# Patient Record
Sex: Female | Born: 1975
Health system: Southern US, Community
[De-identification: ages and names within clinical notes are randomized; demographics above are authoritative.]

## PROBLEM LIST (undated history)

## (undated) DIAGNOSIS — N852 Hypertrophy of uterus: Secondary | ICD-10-CM

## (undated) DIAGNOSIS — R12 Heartburn: Secondary | ICD-10-CM

## (undated) DIAGNOSIS — M722 Plantar fascial fibromatosis: Secondary | ICD-10-CM

## (undated) DIAGNOSIS — J309 Allergic rhinitis, unspecified: Secondary | ICD-10-CM

## (undated) DIAGNOSIS — I10 Essential (primary) hypertension: Secondary | ICD-10-CM

## (undated) DIAGNOSIS — E119 Type 2 diabetes mellitus without complications: Secondary | ICD-10-CM

## (undated) DIAGNOSIS — D219 Benign neoplasm of connective and other soft tissue, unspecified: Secondary | ICD-10-CM

## (undated) DIAGNOSIS — J302 Other seasonal allergic rhinitis: Secondary | ICD-10-CM

## (undated) DIAGNOSIS — G43909 Migraine, unspecified, not intractable, without status migrainosus: Secondary | ICD-10-CM

## (undated) DIAGNOSIS — E0590 Thyrotoxicosis, unspecified without thyrotoxic crisis or storm: Secondary | ICD-10-CM

## (undated) DIAGNOSIS — R7303 Prediabetes: Secondary | ICD-10-CM

## (undated) HISTORY — DX: Hypertrophy of uterus: N85.2

## (undated) HISTORY — DX: Type 2 diabetes mellitus without complications: E11.9

## (undated) HISTORY — DX: Essential (primary) hypertension: I10

## (undated) HISTORY — DX: Prediabetes: R73.03

## (undated) HISTORY — DX: Migraine, unspecified, not intractable, without status migrainosus: G43.909

## (undated) HISTORY — DX: Allergic rhinitis, unspecified: J30.9

## (undated) HISTORY — DX: Plantar fascial fibromatosis: M72.2

## (undated) HISTORY — DX: Benign neoplasm of connective and other soft tissue, unspecified: D21.9

## (undated) HISTORY — DX: Thyrotoxicosis, unspecified without thyrotoxic crisis or storm: E05.90

## (undated) HISTORY — DX: Heartburn: R12

## (undated) HISTORY — DX: Other seasonal allergic rhinitis: J30.2

---

## 2005-12-09 DIAGNOSIS — I517 Cardiomegaly: Secondary | ICD-10-CM

## 2005-12-09 HISTORY — DX: Cardiomegaly: I51.7

## 2009-05-17 ENCOUNTER — Emergency Department (HOSPITAL_COMMUNITY): Admission: EM | Admit: 2009-05-17 | Discharge: 2009-05-17 | Payer: Self-pay | Admitting: Family Medicine

## 2011-02-27 ENCOUNTER — Inpatient Hospital Stay (INDEPENDENT_AMBULATORY_CARE_PROVIDER_SITE_OTHER)
Admission: RE | Admit: 2011-02-27 | Discharge: 2011-02-27 | Disposition: A | Discharge status: Home / Self Care | Payer: Self-pay | Source: Ambulatory Visit | Attending: Emergency Medicine | Admitting: Emergency Medicine

## 2011-02-27 DIAGNOSIS — J309 Allergic rhinitis, unspecified: Secondary | ICD-10-CM

## 2012-05-12 ENCOUNTER — Other Ambulatory Visit: Payer: Self-pay | Admitting: Gastroenterology

## 2012-05-12 DIAGNOSIS — R1011 Right upper quadrant pain: Secondary | ICD-10-CM

## 2012-05-12 DIAGNOSIS — R110 Nausea: Secondary | ICD-10-CM

## 2012-05-25 ENCOUNTER — Ambulatory Visit (HOSPITAL_COMMUNITY)
Admission: RE | Admit: 2012-05-25 | Discharge: 2012-05-25 | Disposition: A | Discharge status: Home / Self Care | Payer: 59 | Source: Ambulatory Visit | Attending: Gastroenterology | Admitting: Gastroenterology

## 2012-05-25 ENCOUNTER — Encounter (HOSPITAL_COMMUNITY)
Admission: RE | Admit: 2012-05-25 | Discharge: 2012-05-25 | Disposition: A | Discharge status: Home / Self Care | Payer: 59 | Source: Ambulatory Visit | Attending: Gastroenterology | Admitting: Gastroenterology

## 2012-05-25 DIAGNOSIS — R1011 Right upper quadrant pain: Secondary | ICD-10-CM | POA: Insufficient documentation

## 2012-05-25 DIAGNOSIS — R110 Nausea: Secondary | ICD-10-CM | POA: Insufficient documentation

## 2012-05-25 MED ORDER — SINCALIDE 5 MCG IJ SOLR
0.0200 ug/kg | Freq: Once | INTRAMUSCULAR | Status: AC
  Administered 2012-05-25: 1.6 ug via INTRAVENOUS

## 2012-05-25 MED ORDER — SINCALIDE 5 MCG IJ SOLR
INTRAMUSCULAR | Status: AC
  Filled 2012-05-25: qty 5

## 2012-05-25 MED ORDER — TECHNETIUM TC 99M MEBROFENIN IV KIT
5.0000 | PACK | Freq: Once | INTRAVENOUS | Status: AC | PRN
  Administered 2012-05-25: 5 via INTRAVENOUS

## 2013-03-11 ENCOUNTER — Emergency Department (HOSPITAL_COMMUNITY): Payer: 59

## 2013-03-11 ENCOUNTER — Emergency Department (HOSPITAL_COMMUNITY)
Admission: EM | Admit: 2013-03-11 | Discharge: 2013-03-11 | Disposition: A | Discharge status: Home / Self Care | Payer: 59 | Attending: Emergency Medicine | Admitting: Emergency Medicine

## 2013-03-11 ENCOUNTER — Encounter (HOSPITAL_COMMUNITY): Payer: Self-pay | Admitting: Cardiology

## 2013-03-11 DIAGNOSIS — M542 Cervicalgia: Secondary | ICD-10-CM | POA: Insufficient documentation

## 2013-03-11 DIAGNOSIS — R0789 Other chest pain: Secondary | ICD-10-CM | POA: Insufficient documentation

## 2013-03-11 DIAGNOSIS — M25519 Pain in unspecified shoulder: Secondary | ICD-10-CM | POA: Insufficient documentation

## 2013-03-11 DIAGNOSIS — R51 Headache: Secondary | ICD-10-CM | POA: Insufficient documentation

## 2013-03-11 DIAGNOSIS — R079 Chest pain, unspecified: Secondary | ICD-10-CM

## 2013-03-11 LAB — BASIC METABOLIC PANEL
BUN: 11 mg/dL (ref 6–23)
CO2: 22 mEq/L (ref 19–32)
Calcium: 9.3 mg/dL (ref 8.4–10.5)
Chloride: 104 mEq/L (ref 96–112)
Creatinine, Ser: 0.71 mg/dL (ref 0.50–1.10)
GFR calc Af Amer: >90 mL/min (ref >90)
GFR calc non Af Amer: >90 mL/min (ref >90)
Glucose, Bld: 94 mg/dL (ref 70–99)
Potassium: 4.5 mEq/L (ref 3.5–5.1)
Sodium: 136 mEq/L (ref 135–145)

## 2013-03-11 LAB — POCT I-STAT TROPONIN I: Troponin i, poc: 0.01 ng/mL (ref 0.00–0.08)

## 2013-03-11 LAB — CBC
HCT: 39.2 % (ref 36.0–46.0)
Hemoglobin: 13.8 g/dL (ref 12.0–15.0)
MCH: 30.2 pg (ref 26.0–34.0)
MCHC: 35.2 g/dL (ref 30.0–36.0)
MCV: 85.8 fL (ref 78.0–100.0)
Platelets: 273 10*3/uL (ref 150–400)
RBC: 4.57 MIL/uL (ref 3.87–5.11)
RDW: 13.4 % (ref 11.5–15.5)
WBC: 4.8 10*3/uL (ref 4.0–10.5)

## 2013-03-11 MED ORDER — SODIUM CHLORIDE 0.9 % IV BOLUS (SEPSIS)
1000.0000 mL | Freq: Once | INTRAVENOUS | Status: AC
  Administered 2013-03-11: 1000 mL via INTRAVENOUS

## 2013-03-11 MED ORDER — DIPHENHYDRAMINE HCL 50 MG/ML IJ SOLN
25.0000 mg | Freq: Once | INTRAMUSCULAR | Status: AC
  Administered 2013-03-11: 25 mg via INTRAVENOUS
  Filled 2013-03-11: qty 1

## 2013-03-11 MED ORDER — LORAZEPAM 2 MG/ML IJ SOLN
1.0000 mg | Freq: Once | INTRAMUSCULAR | Status: AC
  Administered 2013-03-11: 1 mg via INTRAVENOUS
  Filled 2013-03-11: qty 1

## 2013-03-11 MED ORDER — DEXAMETHASONE SODIUM PHOSPHATE 10 MG/ML IJ SOLN
10.0000 mg | Freq: Once | INTRAMUSCULAR | Status: AC
  Administered 2013-03-11: 10 mg via INTRAVENOUS
  Filled 2013-03-11: qty 1

## 2013-03-11 NOTE — ED Notes (Signed)
Pt reports she has been having headaches for the past couple of days. States she also has been having intermittent episodes of chest pain. Denies any n/v or SOB with the pain.

## 2013-03-11 NOTE — ED Provider Notes (Signed)
History     CSN: [id number]  Arrival date & time 03/11/13  0945   First MD Initiated Contact with Patient 03/11/13 1018      Chief Complaint  Patient presents with  . Headache  . Chest Pain    (Consider location/radiation/quality/duration/timing/severity/associated sxs/prior treatment) HPI  37 year old female presents to the emergency department with chief complaint of headache and chest pain.  Patient states that her brother-in-law died suddenly 2 weeks ago.  Since that time she has had a headache.  She's also had significant stress and anxiety during her grieving process.  He does states that she has been taking Tylenol and Advil.  She states that it helped the pain but then her headache returns.  The patient denies a history of migraine headaches.  Denies photophobia, phonophobia, UL throbbing, N/V, visual changes, stiff neck, rash, or "thunderclap" onset.  She does have some neck and shoulder pain.  The patient also complains of intermittent chest pain.  She denies any nausea, vomiting, shortness of breath or diaphoresis.  She has no family history of MI, no hypertension, no diabetes, nonsmoker nondrinker.  She denies any illicit drug use. She denies history of DVT or PE.  The patient uses the melena IUD for birth control. Denies unilateral weakness, facial asymmetry, difficulty with speech, change in gait, or vertigo.    History reviewed. No pertinent past medical history.  History reviewed. No pertinent past surgical history.  History reviewed. No pertinent family history.  History  Substance Use Topics  . Smoking status: Never Smoker   . Smokeless tobacco: Not on file  . Alcohol Use: Yes    OB History   Grav Para Term Preterm Abortions TAB SAB Ect Mult Living                  Review of Systems Ten systems reviewed and are negative for acute change, except as noted in the HPI.   Allergies  Review of patient's allergies indicates no known allergies.  Home  Medications   Current Outpatient Rx  Name  Route  Sig  Dispense  Refill  . Multiple Vitamin (MULTIVITAMIN WITH MINERALS) TABS   Oral   Take 1 tablet by mouth daily.           BP 121/84  Pulse 68  Temp(Src) 97.7 F (36.5 C) (Oral)  Resp 24  SpO2 100%  LMP 03/07/2013  Physical Exam  Constitutional: She is oriented to person, place, and time. She appears well-developed and well-nourished. No distress.  HENT:  Head: Normocephalic and atraumatic.  Eyes: Conjunctivae are normal. No scleral icterus.  Neck: Normal range of motion.  Tender to palpation in the para cervical spinal muscles and trapezius.  Patient also has tenderness in the suboccipital and occipital region.  This feels similar to her headache.   Cardiovascular: Normal rate, regular rhythm and normal heart sounds.  Exam reveals no gallop and no friction rub.   No murmur heard. Pulmonary/Chest: Effort normal and breath sounds normal. No respiratory distress.  Tender to palpation of the chest wall worse on the left side.  This also feels the same as her chest pain complaint.  Abdominal: Soft. Bowel sounds are normal. She exhibits no distension and no mass. There is no tenderness. There is no guarding.  Neurological: She is alert and oriented to person, place, and time.  Skin: Skin is warm and dry. She is not diaphoretic.    ED Course  Procedures (including critical care time)   Results  for orders placed during the hospital encounter of 03/11/13  CBC      Result Value Range   WBC 4.8  4.0 - 10.5 K/uL   RBC 4.57  3.87 - 5.11 MIL/uL   Hemoglobin 13.8  12.0 - 15.0 g/dL   HCT 03.4  74.2 - 59.5 %   MCV 85.8  78.0 - 100.0 fL   MCH 30.2  26.0 - 34.0 pg   MCHC 35.2  30.0 - 36.0 g/dL   RDW 63.8  75.6 - 43.3 %   Platelets 273  150 - 400 K/uL  BASIC METABOLIC PANEL      Result Value Range   Sodium 136  135 - 145 mEq/L   Potassium 4.5  3.5 - 5.1 mEq/L   Chloride 104  96 - 112 mEq/L   CO2 22  19 - 32 mEq/L   Glucose,  Bld 94  70 - 99 mg/dL   BUN 11  6 - 23 mg/dL   Creatinine, Ser 2.95  0.50 - 1.10 mg/dL   Calcium 9.3  8.4 - 18.8 mg/dL   GFR calc non Af Amer >90  >90 mL/min   GFR calc Af Amer >90  >90 mL/min  POCT I-STAT TROPONIN I      Result Value Range   Troponin i, poc 0.01  0.00 - 0.08 ng/mL   Comment 3              Dg Chest 2 View  03/11/2013  *RADIOLOGY REPORT*  Clinical Data: Headache, chest pain  CHEST - 2 VIEW  Comparison: None.  Findings: Lungs are clear. No pleural effusion or pneumothorax.  Cardiomediastinal silhouette is within normal limits.  Visualized osseous structures are within normal limits.  IMPRESSION: No evidence of acute cardiopulmonary disease.   Original Report Authenticated By: Charline Bills, M.D.     Date: 03/11/2013  Rate: 79  Rhythm: normal sinus rhythm  QRS Axis: normal  Intervals: normal  ST/T Wave abnormalities: normal  Conduction Disutrbances: none  Narrative Interpretation:   Old EKG Reviewed: none available      1. Headache   2. Chest pain       MDM  11:45 AM Patient without lab abnormality.  She is PERC and wells negative. I do not suspect ACS.  Overall ECG and negative troponin.  Her pain is reproducible with palpation.  Symptoms non concerning for [facility], ICH, Meningitis, or temporal arteritis. Pt is afebrile with no focal neuro deficits, nuchal rigidity, or change in vision. Treating the patient headache with migraine cocktail and fluids.  We'll reevaluate shortly.  11:49 AM Filed Vitals:   03/11/13 0949 03/11/13 1118 03/11/13 1200 03/11/13 1303  BP: 133/86 121/84 115/86 121/86  Pulse: 78 68 68 71  Temp: 97.7 F (36.5 C)     TempSrc: Oral     Resp: 16 24  16   SpO2: 99% 100% 100% 100%  Patient states that her headache is fully resolved. Presentation is non concerning for [facility], ICH, Meningitis, or temporal arteritis. Pt is afebrile with no focal neuro deficits, nuchal rigidity, or change in vision. Pt is to follow up with PCP to discuss  prophylactic medication. Pt verbalizes understanding and is agreeable with plan to dc.       Arthor Captain, PA-C 03/14/13 1154

## 2013-03-11 NOTE — ED Notes (Signed)
PA at bedside.

## 2013-03-15 NOTE — ED Provider Notes (Signed)
Medical screening examination/treatment/procedure(s) were performed by non-physician practitioner and as supervising physician I was immediately available for consultation/collaboration.  Hawraa Stambaugh R. Daveigh Batty, MD 03/15/13 0818 

## 2013-11-29 ENCOUNTER — Other Ambulatory Visit: Payer: Self-pay | Admitting: Family Medicine

## 2013-11-29 ENCOUNTER — Other Ambulatory Visit (HOSPITAL_COMMUNITY)
Admission: RE | Admit: 2013-11-29 | Discharge: 2013-11-29 | Disposition: A | Discharge status: Home / Self Care | Payer: 59 | Source: Ambulatory Visit | Attending: Family Medicine | Admitting: Family Medicine

## 2013-11-29 DIAGNOSIS — Z01419 Encounter for gynecological examination (general) (routine) without abnormal findings: Secondary | ICD-10-CM | POA: Insufficient documentation

## 2015-04-13 ENCOUNTER — Ambulatory Visit (INDEPENDENT_AMBULATORY_CARE_PROVIDER_SITE_OTHER): Payer: 59

## 2015-04-13 ENCOUNTER — Ambulatory Visit (INDEPENDENT_AMBULATORY_CARE_PROVIDER_SITE_OTHER): Payer: 59 | Admitting: Podiatry

## 2015-04-13 DIAGNOSIS — M722 Plantar fascial fibromatosis: Secondary | ICD-10-CM

## 2015-04-13 MED ORDER — TRIAMCINOLONE ACETONIDE 10 MG/ML IJ SUSP
10.0000 mg | Freq: Once | INTRAMUSCULAR | Status: AC
  Administered 2015-04-13: 10 mg

## 2015-04-13 NOTE — Progress Notes (Signed)
Subjective:     Patient ID: Beth King, female   DOB: [birth year], 39 y.o.   MRN: [id number]  HPI patient states my heel has been awful on my left foot for about 8 months and my right one bothers me at times but not to the same degree   Review of Systems  All other systems reviewed and are negative.      Objective:   Physical Exam  Constitutional: She is oriented to person, place, and time.  Cardiovascular: Intact distal pulses.   Musculoskeletal: Normal range of motion.  Neurological: She is oriented to person, place, and time.  Skin: Skin is warm.  Nursing note and vitals reviewed.  neurovascular status intact with muscle strength adequate range of motion within normal limits. Patient has significant flatfoot deformity bilateral with significant pain upon palpation plantar fascial left over right at the insertion of the tendon into the calcaneus. Patient is well oriented 3 and has good digital perfusion     Assessment:     Severe plantar fasciitis left over right heel with flatfoot deformity as complicating and contribute getting factor    Plan:     H&P and x-rays reviewed with patient. Injected the left plantar fashion 3 mg Kenalog 5 mill grams Xylocaine and applied fascial brace bilateral and order to reduce the stress against the arch is. Discussed long-term orthotics and reappoint one week to reevaluate

## 2015-04-13 NOTE — Patient Instructions (Signed)

## 2015-04-13 NOTE — Progress Notes (Signed)
   Subjective:    Patient ID: Beth King, female    DOB: Jun 29, 1976, 38 y.o.   MRN: [id number]  HPI Pt presents with bilateral foot pain worse in left foot and in the morning. She has tried otc inserts nsaids and ice therapy with no success   Review of Systems  All other systems reviewed and are negative.      Objective:   Physical Exam        Assessment & Plan:

## 2015-04-20 ENCOUNTER — Ambulatory Visit: Payer: 59 | Admitting: Podiatry

## 2015-04-26 ENCOUNTER — Ambulatory Visit (INDEPENDENT_AMBULATORY_CARE_PROVIDER_SITE_OTHER): Payer: 59 | Admitting: Podiatry

## 2015-04-26 ENCOUNTER — Encounter: Payer: Self-pay | Admitting: Podiatry

## 2015-04-26 VITALS — BP 120/72 | HR 97 | Resp 12

## 2015-04-26 DIAGNOSIS — M722 Plantar fascial fibromatosis: Secondary | ICD-10-CM

## 2015-04-27 NOTE — Progress Notes (Signed)
Subjective:     Patient ID: Beth King, female   DOB: 07-19-1976, 39 y.o.   MRN: [id number]  HPI patient presents stating my heels feel better but I've had heel pain for so long and I still have flatfeet deformity   Review of Systems     Objective:   Physical Exam Neurovascular status muscle strength was adequate range of motion within normal limits and noted to have moderate depression of the arch left and right with inflammation still plantar heel upon deep palpation    Assessment:     Patient has moderate collapse of the medial longitudinal arch bilateral with plantar fasciitis bilateral to moderate nature    Plan:     Reviewed condition and discussed long-term anti-inflammatory physical therapy and supportive shoe gear usage. At this time went ahead and scanned for custom orthotics to reduce the stress against the plantar arch and heels

## 2015-07-04 ENCOUNTER — Ambulatory Visit: Payer: 59 | Admitting: *Deleted

## 2015-07-04 DIAGNOSIS — M722 Plantar fascial fibromatosis: Secondary | ICD-10-CM

## 2015-07-04 NOTE — Patient Instructions (Signed)

## 2015-07-05 NOTE — Progress Notes (Signed)
Patient ID: Beth King, female   DOB: 05-Dec-1976, 39 y.o.   MRN: [id number] Patient presents for orthotic pick up.  Verbal and written break in and wear instructions given.  Patient will follow up in 4 weeks if symptoms worsen or fail to improve.

## 2015-07-12 ENCOUNTER — Encounter: Payer: Self-pay | Admitting: Podiatry

## 2015-07-12 ENCOUNTER — Ambulatory Visit (INDEPENDENT_AMBULATORY_CARE_PROVIDER_SITE_OTHER): Payer: 59 | Admitting: Podiatry

## 2015-07-12 VITALS — BP 118/77 | HR 99 | Resp 15

## 2015-07-12 DIAGNOSIS — M722 Plantar fascial fibromatosis: Secondary | ICD-10-CM | POA: Diagnosis not present

## 2015-07-12 MED ORDER — TRIAMCINOLONE ACETONIDE 10 MG/ML IJ SUSP
10.0000 mg | Freq: Once | INTRAMUSCULAR | Status: AC
  Administered 2015-07-12: 10 mg

## 2015-07-12 MED ORDER — DICLOFENAC SODIUM 75 MG PO TBEC: 75.0000 mg | DELAYED_RELEASE_TABLET | Freq: Two times a day (BID) | ORAL | Status: DC

## 2015-07-12 NOTE — Progress Notes (Signed)
Subjective:     Patient ID: Beth King, female   DOB: Sep 26, 1976, 39 y.o.   MRN: [id number]  HPI patient presents stating I have a lot of pain still in my left heel and so far the orthotics or not helping and it's bad during the day when I'm ambulatory   Review of Systems     Objective:   Physical Exam Neurovascular status intact muscle strength adequate with significant discomfort in the plantar aspect of the left heel at the insertion of the tendon into the calcaneus with inflammation and fluid buildup noted. Depressed arch also noted    Assessment:     Plantar fasciitis left with inflammation and fluid at the insertion of the tendon into the calcaneus    Plan:     H&P and condition reviewed with patient. Today I went ahead and I injected the plantar fascia 3 [place] Kenalog 5 mill grams Xylocaine and dispensed night splint and discussed the possibility if this does not get better due to long-term nature that it may require surgical intervention. Patient is scheduled for reappoint in 4 weeks. Also placed on diclofenac 75 mg twice a day

## 2015-07-12 NOTE — Patient Instructions (Signed)

## 2015-08-09 ENCOUNTER — Ambulatory Visit: Payer: 59 | Admitting: Podiatry

## 2015-10-19 ENCOUNTER — Other Ambulatory Visit: Payer: Self-pay | Admitting: Obstetrics and Gynecology

## 2015-10-19 DIAGNOSIS — N6001 Solitary cyst of right breast: Secondary | ICD-10-CM

## 2015-10-25 ENCOUNTER — Other Ambulatory Visit: Payer: 59

## 2015-11-01 ENCOUNTER — Ambulatory Visit
Admission: RE | Admit: 2015-11-01 | Discharge: 2015-11-01 | Disposition: A | Discharge status: Home / Self Care | Payer: 59 | Source: Ambulatory Visit | Attending: Obstetrics and Gynecology | Admitting: Obstetrics and Gynecology

## 2015-11-01 DIAGNOSIS — N6001 Solitary cyst of right breast: Secondary | ICD-10-CM

## 2016-01-08 DIAGNOSIS — M722 Plantar fascial fibromatosis: Secondary | ICD-10-CM | POA: Diagnosis not present

## 2016-01-11 ENCOUNTER — Ambulatory Visit: Payer: 59 | Attending: Orthopaedic Surgery | Admitting: Physical Therapy

## 2016-01-11 DIAGNOSIS — M259 Joint disorder, unspecified: Secondary | ICD-10-CM | POA: Insufficient documentation

## 2016-01-11 DIAGNOSIS — M79672 Pain in left foot: Secondary | ICD-10-CM | POA: Insufficient documentation

## 2016-01-11 DIAGNOSIS — R29898 Other symptoms and signs involving the musculoskeletal system: Secondary | ICD-10-CM

## 2016-01-11 DIAGNOSIS — R6889 Other general symptoms and signs: Secondary | ICD-10-CM | POA: Diagnosis not present

## 2016-01-11 DIAGNOSIS — M79671 Pain in right foot: Secondary | ICD-10-CM | POA: Insufficient documentation

## 2016-01-11 DIAGNOSIS — R269 Unspecified abnormalities of gait and mobility: Secondary | ICD-10-CM | POA: Insufficient documentation

## 2016-01-11 NOTE — Patient Instructions (Signed)
   Deloma Spindle PT, DPT, LAT, ATC  [facility] Phone: [phone]     

## 2016-01-11 NOTE — Therapy (Signed)
[place], [place], [address] Phone: [phone]   Fax:  [phone]  Physical Therapy Evaluation  Patient Details  Name: Beth King MRN: [id number] Date of Birth: [birth year] Referring Provider: Christena Flake Md  Encounter Date: 01/11/2016      PT End of Session - 01/11/16 1251    Visit Number 1   Number of Visits 12   Date for PT Re-Evaluation 02/22/16   PT Start Time 0845   PT Stop Time 0930   PT Time Calculation (min) 45 min   Activity Tolerance Patient tolerated treatment well   Behavior During Therapy [place] for tasks assessed/performed      No past medical history on file.  No past surgical history on file.  There were no vitals filed for this visit.  Visit Diagnosis:  Heel pain, bilateral - Plan: PT plan of care cert/re-cert  Abnormality of gait - Plan: PT plan of care cert/re-cert  Ankle weakness - Plan: PT plan of care cert/re-cert  Activity intolerance - Plan: PT plan of care cert/re-cert      Subjective Assessment - 01/11/16 0857    Subjective pt is a 40 y.o F with CC of bil plantar Fasciitis with L>R that has been going on for 2 years. she reports having 2 shots in the foot in her left foot and was given some stretches. she reports most pain when waking up in the morning a t a 9/10 in L and 5/10 in L.  in the last couple of months she reports the pain has gotten worse with pain mostly in the heel but with radiating pain to the calf.    Limitations Standing;Walking;House hold activities   How long can you sit comfortably? 1 hour   How long can you stand comfortably? 10 -15 min   How long can you walk comfortably? 30 min   Diagnostic tests 04/13/2015 x-ray plantar fasciits bil   Patient Stated Goals to be normal and walk without pain, and return to the gym.    Currently in Pain? Yes   Pain Score 8   5/10 on R, 8/10   Pain Location Foot   Pain Orientation Right;Left  L>R   Pain  Descriptors / Indicators Sharp   Pain Type Chronic pain   Pain Radiating Towards bil calves   Pain Onset More than a month ago   Aggravating Factors  first getting up in the morning, Hot water, wearing heels, prolonged standing,    Pain Relieving Factors ice            [facility] PT Assessment - 01/11/16 0902    Assessment   Medical Diagnosis bil plantrar fasciitis L>R   Referring Provider Christena Flake Md   Onset Date/Surgical Date --  2 years   Hand Dominance Right   Next MD Visit --  6 weeks   Prior Therapy No    Precautions   Precautions None   Restrictions   Weight Bearing Restrictions No   Balance Screen   Has the patient fallen in the past 6 months No   Has the patient had a decrease in activity level because of a fear of falling?  No   Is the patient reluctant to leave their home because of a fear of falling?  No   Home Social worker Private residence   Living Arrangements Spouse/significant other   Available Help at Discharge Available PRN/intermittently   Type of [place]  Home Access Stairs to enter   Entrance Stairs-Number of Steps 3   Entrance Stairs-Rails None   Home Layout One level   Home Equipment Other (comment)  night splint   Prior Function   Level of Independence Independent;Independent with basic ADLs   Vocation Part time employment  Nurse tech   Vocation Requirements prolonged walking, standing, lifting, moving, pushing, pulling   Leisure eating, going gym,    Cognition   Overall Cognitive Status Within Functional Limits for tasks assessed   Observation/Other Assessments   Lower Extremity Functional Scale  40/80   Posture/Postural Control   Posture/Postural Control Postural limitations   Postural Limitations Rounded Shoulders;Forward head   ROM / Strength   AROM / PROM / Strength AROM;PROM;Strength   AROM   AROM Assessment Site Ankle   Right/Left Ankle Right;Left   Right Ankle Dorsiflexion 6  from neutral   Right  Ankle Plantar Flexion 30  from nuetral   Right Ankle Inversion 22   Right Ankle Eversion 16   Left Ankle Dorsiflexion 8  past neutral   Left Ankle Plantar Flexion 30  past nuetral   Left Ankle Inversion 20   Left Ankle Eversion 18   PROM   PROM Assessment Site Ankle   Right/Left Ankle Right;Left   Right Ankle Dorsiflexion 22  from neutral   Right Ankle Plantar Flexion 40   Right Ankle Inversion 30   Right Ankle Eversion 24   Left Ankle Dorsiflexion 20  past neutral   Left Ankle Plantar Flexion 32  past neutral   Left Ankle Inversion 30   Left Ankle Eversion 24   Strength   Strength Assessment Site Ankle   Right/Left Ankle Right;Left   Right Ankle Dorsiflexion 4/5   Right Ankle Plantar Flexion 4-/5   Right Ankle Inversion 3+/5   Right Ankle Eversion 3+/5   Left Ankle Dorsiflexion 4-/5   Left Ankle Plantar Flexion 4/5   Left Ankle Inversion 4-/5   Left Ankle Eversion 3+/5   Palpation   Palpation comment L Medial Calcaneal tubercle tenderness, along plantar fascia with great toe extension, tightness/tenderness along gastroc and soleus with multiple trigger points noted    Ambulation/Gait   Gait Pattern Step-through pattern;Decreased stride length;Antalgic;Right steppage;Left steppage                           PT Education - 01/11/16 1251    Education provided Yes   Education Details evaluation findings, POC, Goals, HEP   Person(s) Educated Patient   Methods Explanation   Comprehension Verbalized understanding          PT Short Term Goals - 01/11/16 1300    PT SHORT TERM GOAL #1   Title pt will be I with inital HEP (02/01/2016)   Time 3   Period Weeks   Status New   PT SHORT TERM GOAL #2   Title pt will be able to verbalize and demonstrate techniques to reduce bil heel pain and inflammation via RICE(02/01/2016)   Time 3   Period Weeks   Status New           PT Long Term Goals - 01/11/16 1303    PT LONG TERM GOAL #1   Title pt will  be I with all HEP as of last visit ( 02/22/2016)   Time 6   Period Weeks   Status New   PT LONG TERM GOAL #2   Title pt will improve bil  ankle strength to >/= 4+/5 in all planes to assist with prlonged standing with </=3/10 pain (02/22/2016)   Time 6   Period Weeks   Status New   PT LONG TERM GOAL #3   Title pt will be able to stand for >/= 45 minutes with </=3/10 pain to assist with job related activities (02/22/2016)   Time 6   Period Weeks   Status New   PT LONG TERM GOAL #4   Title pt will improve her LEFS score by >/=10 points to demonstrate improvement in function (02/22/2016)   Time 6   Period Weeks   Status New               Plan - 01/11/16 1252    Clinical Impression Statement Fumiko presents a low complexity evluation with bil plantat fasciitis with L>R. She dmeonstrate functional Ankle mobility bil with limited AROM in plantarflexion. MMT presented with weakness of bil ankles with no pain during testing, in all planes. she demonstrated tendneress in the medial calcaneal tubercles bil with tightness and pain the gastroc/ soleus. She currently ambualted with an antlalgic gait pattern with limited stride and step length bil. She would benefit from physical therapy to decrease bil heel pain and return to PLOF by addressing the impairments listed.    Pt will benefit from skilled therapeutic intervention in order to improve on the following deficits Pain;Improper body mechanics;Difficulty walking;Decreased range of motion;Decreased endurance;Decreased activity tolerance;Decreased strength;Hypomobility;Decreased balance   Rehab Potential Good   PT Frequency 2x / week   PT Duration 6 weeks   PT Treatment/Interventions ADLs/Self Care Home Management;Cryotherapy;Electrical Stimulation;Iontophoresis 4mg /ml Dexamethasone;Moist Heat;Therapeutic exercise;Therapeutic activities;Manual techniques;Taping;Dry needling;Passive range of motion;Patient/family education;Ultrasound   PT Next  Visit Plan assess and review HEP, discuss dry needling, iontophoresis if cert is signed, calf stretching, ankle strengthening, Manual for PF   PT Home Exercise Plan calf stretchs, ankle strengthening, seated towel scrunches, rolling on frozen ice bottle    Consulted and Agree with Plan of Care Patient         Problem List There are no active problems to display for this patient.  Starr [place] PT, DPT, LAT, ATC  01/11/2016  1:12 PM     [facility] [address] [place], [place], [address] Phone: [phone]   Fax:  [phone]  Name: Beth King MRN: [id number] Date of Birth: Mar 26, 1976

## 2016-01-16 ENCOUNTER — Ambulatory Visit: Payer: 59 | Admitting: Physical Therapy

## 2016-01-16 DIAGNOSIS — M259 Joint disorder, unspecified: Secondary | ICD-10-CM | POA: Diagnosis not present

## 2016-01-16 DIAGNOSIS — M79672 Pain in left foot: Secondary | ICD-10-CM | POA: Diagnosis not present

## 2016-01-16 DIAGNOSIS — M79671 Pain in right foot: Secondary | ICD-10-CM | POA: Diagnosis not present

## 2016-01-16 DIAGNOSIS — R269 Unspecified abnormalities of gait and mobility: Secondary | ICD-10-CM

## 2016-01-16 DIAGNOSIS — R6889 Other general symptoms and signs: Secondary | ICD-10-CM | POA: Diagnosis not present

## 2016-01-16 NOTE — Therapy (Signed)
[place], [place], [address] Phone: [phone]   Fax:  [phone]  Physical Therapy Treatment  Patient Details  Name: Beth King MRN: [id number] Date of Birth: Dec 21, 1975 Referring Provider: Christena Flake Md  Encounter Date: 01/16/2016      PT End of Session - 01/16/16 1511    Visit Number 2   Number of Visits 12   Date for PT Re-Evaluation 02/22/16   PT Start Time 1034   PT Stop Time 1102   PT Time Calculation (min) 28 min   Activity Tolerance Patient tolerated treatment well   Behavior During Therapy [facility] for tasks assessed/performed      No past medical history on file.  No past surgical history on file.  There were no vitals filed for this visit.  Visit Diagnosis:  Heel pain, bilateral  Abnormality of gait      Subjective Assessment - 01/16/16 1501    Subjective Short session, patient late thinking her appointment was another time.   She has orthotics she had made 6 months ago and they do no help.  She wears shoes in the house avoiding barefeet.     Currently in Pain? Yes   Pain Score 8   LT 8/10 RT 5/10   Pain Location Foot   Pain Orientation Right;Left   Pain Descriptors / Indicators Aching;Sharp   Pain Radiating Towards both calves.    Aggravating Factors  first getting up, walking. standing   Pain Relieving Factors ice,  rolling on frozen bottle                         [facility] Adult PT Treatment/Exercise - 01/16/16 1045    Iontophoresis   Type of Iontophoresis Dexamethasone   Location heels , both medial   Dose 1cc of 4 mg/ml   Time 9   Manual Therapy   Manual therapy comments Manual soft tissue, instrument assist to fascia tissue, heel , calf distal.  tissue softened, and sensitive. LT great toe extension very stiff. ,  gentle toe stretches.                  PT Education - 01/16/16 1510    Education provided Yes   Education Details ionto  precautions   Person(s) Educated Patient   Methods Explanation;Handout   Comprehension Verbalized understanding          PT Short Term Goals - 01/16/16 1517    PT SHORT TERM GOAL #1   Title pt will be I with inital HEP (02/01/2016)   Time 3   Period Weeks   Status On-going   PT SHORT TERM GOAL #2   Title pt will be able to verbalize and demonstrate techniques to reduce bil heel pain and inflammation via RICE(02/01/2016)   Time 3   Period Weeks   Status On-going           PT Long Term Goals - 01/11/16 1303    PT LONG TERM GOAL #1   Title pt will be I with all HEP as of last visit ( 02/22/2016)   Time 6   Period Weeks   Status New   PT LONG TERM GOAL #2   Title pt will improve bil ankle strength to >/= 4+/5 in all planes to assist with prlonged standing with </=3/10 pain (02/22/2016)   Time 6   Period Weeks   Status New   PT LONG TERM  GOAL #3   Title pt will be able to stand for >/= 45 minutes with </=3/10 pain to assist with job related activities (02/22/2016)   Time 6   Period Weeks   Status New   PT LONG TERM GOAL #4   Title pt will improve her LEFS score by >/=10 points to demonstrate improvement in function (02/22/2016)   Time 6   Period Weeks   Status New               Plan - 01/16/16 1512    Clinical Impression Statement Short session.  Manual helpful but didn't really have to time to really work on all fo calf,  distal 1/3 of both mainly.  I suggested she return to clinic for orthotics modification since she has them and cannot use them. No new goals met.  Patient has thick skin on feet even tho she has not been walking barefeet for 2 years.     PT Next Visit Plan assess and review HEP, discuss dry needling, iontophoresis assess, calf stretching, ankle strengthening, Manual for PF   PT Home Exercise Plan calf stretchs, ankle strengthening, seated towel scrunches, rolling on frozen ice bottle    Consulted and Agree with Plan of Care Patient         Problem List There are no active problems to display for this patient.   [facility] 01/16/2016, 3:20 PM  [facility] [address] [place], [place], [address] Phone: [phone]   Fax:  [phone]  Name: Beth King MRN: [id number] Date of Birth: 06-28-1976    Melvenia Needles, PTA 01/16/2016 3:20 PM Phone: [phone] Fax: [phone]

## 2016-01-16 NOTE — Patient Instructions (Addendum)

## 2016-01-22 ENCOUNTER — Ambulatory Visit: Payer: 59 | Admitting: Physical Therapy

## 2016-01-22 DIAGNOSIS — M259 Joint disorder, unspecified: Secondary | ICD-10-CM | POA: Diagnosis not present

## 2016-01-22 DIAGNOSIS — M79672 Pain in left foot: Secondary | ICD-10-CM | POA: Diagnosis not present

## 2016-01-22 DIAGNOSIS — R269 Unspecified abnormalities of gait and mobility: Secondary | ICD-10-CM | POA: Diagnosis not present

## 2016-01-22 DIAGNOSIS — R6889 Other general symptoms and signs: Secondary | ICD-10-CM | POA: Diagnosis not present

## 2016-01-22 DIAGNOSIS — M79671 Pain in right foot: Secondary | ICD-10-CM

## 2016-01-22 DIAGNOSIS — R29898 Other symptoms and signs involving the musculoskeletal system: Secondary | ICD-10-CM

## 2016-01-22 NOTE — Therapy (Signed)
[place], [place], [address] Phone: [phone]   Fax:  [phone]  Physical Therapy Treatment  Patient Details  Name: Beth King MRN: [id number] Date of Birth: [birth year] Referring Provider: Christena Flake Md  Encounter Date: 01/22/2016      PT End of Session - 01/22/16 1302    Visit Number 3   Number of Visits 12   Date for PT Re-Evaluation 02/22/16   PT Start Time [id number]   PT Stop Time 1238   PT Time Calculation (min) 50 min   Activity Tolerance Patient tolerated treatment well   Behavior During Therapy [facility] for tasks assessed/performed      No past medical history on file.  No past surgical history on file.  There were no vitals filed for this visit.  Visit Diagnosis:  Heel pain, bilateral  Abnormality of gait  Ankle weakness  Activity intolerance      Subjective Assessment - 01/22/16 1156    Subjective "I haven't seen much change in the pain yet" she reports being consistent with her HEP has really helped is the water bottle.    Currently in Pain? Yes   Pain Score 8   L 8/10, R 3/10   Pain Orientation Left;Right   Pain Descriptors / Indicators Aching;Sharp   Pain Type Chronic pain   Pain Onset More than a month ago   Pain Frequency Constant   Aggravating Factors  first getting up in the morning, walking, standing,    Pain Relieving Factors ice, rolling on frozen bottle.                          [facility] Adult PT Treatment/Exercise - 01/22/16 1306    Iontophoresis   Type of Iontophoresis Dexamethasone   Location L heel   Dose 1CC    Time 6 hour stat patch   Manual Therapy   Manual Therapy Myofascial release;Soft tissue mobilization   Soft tissue mobilization instrument assisted STM over the medial calcaneal tubercle with twisting technique, along PF with Great toe extension,  along gastroc/soleus   Myofascial Release manual triggerpoint release x 3 in  gastroc/soleus, and in medial arch x 1    Ankle Exercises: Stretches   Soleus Stretch 2 reps;30 seconds   Gastroc Stretch 2 reps;30 seconds          Trigger Point Dry Needling - 01/22/16 1306    Consent Given? Yes   Education Handout Provided Yes   Muscles Treated Lower Body Quadratus plantae   Quadatus Plantae Response Twitch response elicited;Palpable increased muscle length  L only              PT Education - 01/22/16 1302    Education provided Yes   Education Details dry needling education   Person(s) Educated Patient   Methods Explanation   Comprehension Verbalized understanding          PT Short Term Goals - 01/16/16 1517    PT SHORT TERM GOAL #1   Title pt will be I with inital HEP (02/01/2016)   Time 3   Period Weeks   Status On-going   PT SHORT TERM GOAL #2   Title pt will be able to verbalize and demonstrate techniques to reduce bil heel pain and inflammation via RICE(02/01/2016)   Time 3   Period Weeks   Status On-going           PT Long Term Goals -  01/11/16 1303    PT LONG TERM GOAL #1   Title pt will be I with all HEP as of last visit ( 02/22/2016)   Time 6   Period Weeks   Status New   PT LONG TERM GOAL #2   Title pt will improve bil ankle strength to >/= 4+/5 in all planes to assist with prlonged standing with </=3/10 pain (02/22/2016)   Time 6   Period Weeks   Status New   PT LONG TERM GOAL #3   Title pt will be able to stand for >/= 45 minutes with </=3/10 pain to assist with job related activities (02/22/2016)   Time 6   Period Weeks   Status New   PT LONG TERM GOAL #4   Title pt will improve her LEFS score by >/=10 points to demonstrate improvement in function (02/22/2016)   Time 6   Period Weeks   Status New               Plan - 01/22/16 1302    Clinical Impression Statement Lacoria reports that she previously hasn't noticed much benefit from physical therapy for her feet. she provided consent for dry needling on the L  quadratus plantae and reported relief of pain following DN and instrument assisted STM ove rthe arch and medial calcaneal tubercle. with manual triggerpoint release of the L calf and stretching she reported 0/10 pain with standing and walking. ulitized another round of inontophoresis to prrevent further inflammation.    PT Next Visit Plan  assesss response to dry needling, iontophoresis assess, calf stretching, ankle strengthening, Manual for PF   PT Home Exercise Plan no new HEP   Consulted and Agree with Plan of Care Patient        Problem List There are no active problems to display for this patient.  Starr Lake PT, DPT, LAT, ATC  01/22/2016  1:09 PM      [facility] [address] [place], [place], [address] Phone: [phone]   Fax:  [phone]  Name: SHAKILAH LINNEBUR MRN: [id number] Date of Birth: [birth year]

## 2016-01-24 ENCOUNTER — Encounter: Payer: 59 | Admitting: Physical Therapy

## 2016-01-29 ENCOUNTER — Encounter: Payer: 59 | Admitting: Physical Therapy

## 2016-02-01 ENCOUNTER — Ambulatory Visit: Payer: 59 | Admitting: Physical Therapy

## 2016-02-01 DIAGNOSIS — M259 Joint disorder, unspecified: Secondary | ICD-10-CM | POA: Diagnosis not present

## 2016-02-01 DIAGNOSIS — R269 Unspecified abnormalities of gait and mobility: Secondary | ICD-10-CM | POA: Diagnosis not present

## 2016-02-01 DIAGNOSIS — R6889 Other general symptoms and signs: Secondary | ICD-10-CM | POA: Diagnosis not present

## 2016-02-01 DIAGNOSIS — M79672 Pain in left foot: Principal | ICD-10-CM

## 2016-02-01 DIAGNOSIS — M79671 Pain in right foot: Secondary | ICD-10-CM | POA: Diagnosis not present

## 2016-02-01 DIAGNOSIS — R29898 Other symptoms and signs involving the musculoskeletal system: Secondary | ICD-10-CM

## 2016-02-01 NOTE — Therapy (Signed)
[place], [place], [address] Phone: [phone]   Fax:  [phone]  Physical Therapy Treatment  Patient Details  Name: Beth King MRN: [id number] Date of Birth: 1976-09-06 Referring Provider: Christena Flake Md  Encounter Date: 02/01/2016      PT End of Session - 02/01/16 1234    Visit Number 4   Number of Visits 12   Date for PT Re-Evaluation 02/22/16   PT Start Time 1147   PT Stop Time 1244   PT Time Calculation (min) 57 min   Activity Tolerance Patient tolerated treatment well   Behavior During Therapy [facility] for tasks assessed/performed      No past medical history on file.  No past surgical history on file.  There were no vitals filed for this visit.  Visit Diagnosis:  Heel pain, bilateral  Abnormality of gait  Ankle weakness  Activity intolerance      Subjective Assessment - 02/01/16 1149    Subjective "I had no pain after last session, but at night my ankle swelled up and the pain came back"   Currently in Pain? Yes   Pain Score 8    Pain Location Foot   Pain Orientation Right;Left   Pain Descriptors / Indicators Aching;Sharp   Pain Type Chronic pain   Pain Onset More than a month ago   Pain Frequency Constant   Aggravating Factors  first getting up in the morning, waking, standing   Pain Relieving Factors ice, rolling, on frozen bottle                          [facility] Adult PT Treatment/Exercise - 02/01/16 0001    Modalities   Modalities Moist Heat   Moist Heat Therapy   Number Minutes Moist Heat 6 Minutes  bil calfs   Manual Therapy   Soft tissue mobilization instrument assisted STM over the medial calcaneal tubercle with twisting technique, along PF with Great toe extension,  along gastroc/soleus   Myofascial Release manual triggerpoint release x 3 in gastroc/soleus, and in medial arch x 3   Ankle Exercises: Stretches   Soleus Stretch 3 reps;30 seconds   felt this stretch more   Gastroc Stretch 3 reps;30 seconds   Ankle Exercises: Seated   Heel Raises 10 reps  with metatarsals heads on rolled up towel                PT Education - 02/01/16 1247    Education provided Yes   Education Details reviewed HEP and educated importance of doing them and not just icing for the pain   Person(s) Educated Patient   Methods Explanation   Comprehension Verbalized understanding          PT Short Term Goals - 02/01/16 1242    PT SHORT TERM GOAL #1   Title pt will be I with inital HEP (02/01/2016)   Baseline not doing her HEP only icing   Time 3   Period Weeks   Status On-going   PT SHORT TERM GOAL #2   Title pt will be able to verbalize and demonstrate techniques to reduce bil heel pain and inflammation via RICE(02/01/2016)   Time 3   Period Weeks   Status On-going           PT Long Term Goals - 02/01/16 1242    PT LONG TERM GOAL #1   Title pt will be I with all  HEP as of last visit ( 02/22/2016)   Period Weeks   Status On-going   PT LONG TERM GOAL #2   Title pt will improve bil ankle strength to >/= 4+/5 in all planes to assist with prlonged standing with </=3/10 pain (02/22/2016)   Time 6   Period Weeks   Status On-going   PT LONG TERM GOAL #3   Title pt will be able to stand for >/= 45 minutes with </=3/10 pain to assist with job related activities (02/22/2016)   Time 6   Period Weeks   Status On-going   PT LONG TERM GOAL #4   Title pt will improve her LEFS score by >/=10 points to demonstrate improvement in function (02/22/2016)   Time 6   Period Weeks   Status On-going               Plan - 02/01/16 1234    Clinical Impression Statement US repored the pain came back after the last visit when she got home. she reports only doing the ice from her HEP and not doing the stretches or other exercises. Following manual of the calves and heels she reported pain dropped to 8/10 to a 3/10 following todays session.  Plan to assess if she is doing her exercises next visit.    PT Next Visit Plan  iontophoresis assess, calf stretching, ankle strengthening, Manual for PF,    PT Home Exercise Plan reviewed HEP   Consulted and Agree with Plan of Care Patient        Problem List There are no active problems to display for this patient.  Starr Lake PT, DPT, LAT, ATC  02/01/2016  12:48 PM     [facility] [address] [place], [place], [address] Phone: [phone]   Fax:  [phone]  Name: Beth King MRN: [id number] Date of Birth: 1976-10-11

## 2016-02-05 ENCOUNTER — Encounter: Payer: 59 | Admitting: Physical Therapy

## 2016-02-08 ENCOUNTER — Ambulatory Visit: Payer: 59 | Attending: Orthopaedic Surgery | Admitting: Physical Therapy

## 2016-02-08 DIAGNOSIS — M79672 Pain in left foot: Secondary | ICD-10-CM | POA: Insufficient documentation

## 2016-02-08 DIAGNOSIS — M79671 Pain in right foot: Secondary | ICD-10-CM | POA: Insufficient documentation

## 2016-02-08 DIAGNOSIS — R6889 Other general symptoms and signs: Secondary | ICD-10-CM | POA: Diagnosis not present

## 2016-02-08 DIAGNOSIS — R29898 Other symptoms and signs involving the musculoskeletal system: Secondary | ICD-10-CM

## 2016-02-08 DIAGNOSIS — R269 Unspecified abnormalities of gait and mobility: Secondary | ICD-10-CM | POA: Insufficient documentation

## 2016-02-08 DIAGNOSIS — M259 Joint disorder, unspecified: Secondary | ICD-10-CM | POA: Insufficient documentation

## 2016-02-08 NOTE — Therapy (Addendum)
[place], [place], [address] Phone: [phone]   Fax:  [phone]  Physical Therapy Treatment / Discharge Note  Patient Details  Name: Beth King MRN: [id number] Date of Birth: Mar 08, 1976 Referring Provider: Christena Flake Md  Encounter Date: 02/08/2016      PT End of Session - 02/08/16 1216    Visit Number 5   Number of Visits 12   Date for PT Re-Evaluation 02/22/16   PT Start Time 3704   PT Stop Time 1229   PT Time Calculation (min) 44 min   Activity Tolerance Patient tolerated treatment well   Behavior During Therapy [facility] for tasks assessed/performed      No past medical history on file.  No past surgical history on file.  There were no vitals filed for this visit.  Visit Diagnosis:  Heel pain, bilateral  Abnormality of gait  Ankle weakness  Activity intolerance      Subjective Assessment - 02/08/16 1147    Subjective "Pt reports she has been doing her HEP every day, she reports it helps some, and she got some stuff sent to her from [place] and isn't sure if it is working or not.    Currently in Pain? Yes   Pain Score 6    Pain Location Foot   Pain Orientation Left   Pain Descriptors / Indicators Aching;Sharp   Pain Type Chronic pain   Pain Frequency Constant   Aggravating Factors  prolonged standing   Multiple Pain Sites Yes   Pain Score 3   Pain Location Foot   Pain Orientation Right   Pain Descriptors / Indicators Constant;Sore   Pain Type Chronic pain   Pain Onset More than a month ago   Pain Frequency Intermittent   Aggravating Factors  prolonged standing                          [facility] Adult PT Treatment/Exercise - 02/08/16 1209    Self-Care   Self-Care Other Self-Care Comments   Other Self-Care Comments  discussed performing stretches    Manual Therapy   Soft tissue mobilization instrument assisted STM over the medial calcaneal tubercle with  twisting technique, along PF with Great toe extension,  along gastroc/soleus   Myofascial Release manual triggerpoint release x 3 in gastroc/soleus, and in medial arch x 3   Ankle Exercises: Stretches   Soleus Stretch 2 reps;30 seconds   Gastroc Stretch 2 reps;30 seconds   Ankle Exercises: Seated   Towel Crunch 5 reps  rest given at 3 and 4 reps   Heel Slides 15 reps  with met heads on towel to get PF stretch   Other Seated Ankle Exercises rolling bil PF on stress buster x 2 min each                  PT Short Term Goals - 02/08/16 1232    PT SHORT TERM GOAL #1   Title pt will be I with inital HEP (02/01/2016)   Time 3   Period Weeks   Status Achieved   PT SHORT TERM GOAL #2   Title pt will be able to verbalize and demonstrate techniques to reduce bil heel pain and inflammation via RICE(02/01/2016)   Period Weeks   Status Achieved           PT Long Term Goals - 02/08/16 1232    PT LONG TERM GOAL #1  Title pt will be I with all HEP as of last visit ( 02/22/2016)   Time 6   Period Weeks   Status On-going   PT LONG TERM GOAL #2   Title pt will improve bil ankle strength to >/= 4+/5 in all planes to assist with prlonged standing with </=3/10 pain (02/22/2016)   Time 6   Period Weeks   Status On-going   PT LONG TERM GOAL #3   Title pt will be able to stand for >/= 45 minutes with </=3/10 pain to assist with job related activities (02/22/2016)   Time 6   Period Weeks   Status On-going   PT LONG TERM GOAL #4   Title pt will improve her LEFS score by >/=10 points to demonstrate improvement in function (02/22/2016)   Time 6   Status On-going               Plan - 02/08/16 1229    Clinical Impression Statement Beth King reports that she has been consistent with her HEP and that her pain is getting better. peformed manual to calm down tightness in bil calves and PF and stretching of the calf muscles. she met all STG today.following todays session she reported 5/10  pain in the L foot, and 0/10 pain inthe R foot. Visual inspection reveals increased swelling in the L ankle and calf region which could be contributing to increased pain in the left heel compared bil.    PT Next Visit Plan  calf stretching, ankle strengthening, Manual for PF, progress exercise PRN, assess if more visits are necessary   Consulted and Agree with Plan of Care Patient        Problem List There are no active problems to display for this patient.  Starr Lake PT, DPT, LAT, ATC  02/08/2016  12:35 PM     [facility] [address] [place], [place], [address] Phone: [phone]   Fax:  [phone]  Name: Beth King MRN: [id number] Date of Birth: 1976/09/22    PHYSICAL THERAPY DISCHARGE SUMMARY  Visits from Start of Care: 5  Current functional level related to goals / functional outcomes: See goals   Remaining deficits: Unknown   Education / Equipment: HEP, theraband for strengthening  Plan: Patient agrees to discharge.  Patient goals were partially met. Patient is being discharged due to not returning since the last visit.  ?????     Jessilyn Catino PT, DPT, LAT, ATC  11/07/16  12:09 PM

## 2016-02-12 ENCOUNTER — Ambulatory Visit (INDEPENDENT_AMBULATORY_CARE_PROVIDER_SITE_OTHER): Payer: 59 | Admitting: Podiatry

## 2016-02-12 ENCOUNTER — Encounter: Payer: 59 | Admitting: Physical Therapy

## 2016-02-12 ENCOUNTER — Ambulatory Visit: Payer: Self-pay

## 2016-02-12 ENCOUNTER — Encounter: Payer: Self-pay | Admitting: Podiatry

## 2016-02-12 DIAGNOSIS — M779 Enthesopathy, unspecified: Secondary | ICD-10-CM

## 2016-02-12 DIAGNOSIS — R600 Localized edema: Secondary | ICD-10-CM | POA: Diagnosis not present

## 2016-02-12 DIAGNOSIS — M722 Plantar fascial fibromatosis: Secondary | ICD-10-CM | POA: Diagnosis not present

## 2016-02-12 MED ORDER — HYDROCODONE-ACETAMINOPHEN 10-325 MG PO TABS: 1.0000 | ORAL_TABLET | Freq: Three times a day (TID) | ORAL | Status: DC | PRN

## 2016-02-14 ENCOUNTER — Ambulatory Visit: Payer: 59 | Admitting: Physical Therapy

## 2016-02-14 DIAGNOSIS — R29898 Other symptoms and signs involving the musculoskeletal system: Secondary | ICD-10-CM

## 2016-02-14 DIAGNOSIS — R269 Unspecified abnormalities of gait and mobility: Secondary | ICD-10-CM

## 2016-02-14 DIAGNOSIS — R6889 Other general symptoms and signs: Secondary | ICD-10-CM | POA: Diagnosis not present

## 2016-02-14 DIAGNOSIS — M79672 Pain in left foot: Secondary | ICD-10-CM | POA: Diagnosis not present

## 2016-02-14 DIAGNOSIS — M259 Joint disorder, unspecified: Secondary | ICD-10-CM | POA: Diagnosis not present

## 2016-02-14 DIAGNOSIS — M79671 Pain in right foot: Secondary | ICD-10-CM | POA: Diagnosis not present

## 2016-02-14 NOTE — Progress Notes (Signed)
Subjective:     Patient ID: Beth King, female   DOB: 1976/06/26, 40 y.o.   MRN: [id number]  HPI patient states I'm getting swelling in both my legs left over right and I don't remember history of trauma   Review of Systems     Objective:   Physical Exam Neurovascular status was found to be intact with patient having forefoot and ankle edema left over right +2 pitting on the left +1 pitting on the right with mild discomfort in the posterior calf muscle left over right with no systemic signs of pulmonary embolus or other conditions.    Assessment:     Possibility for some form of vein condition or possible clot creating this edematous process or possible systemic condition versus localized condition    Plan:     H&P conditions reviewed with patient and x-rays reviewed. Today I am sending for Doppler studies to rule out any form of vein condition and that we'll be done and is scheduled. I also discussed if she should develop any shortness of breath increased leg swelling or other pathology to go straight to the emergency room or her internal Dr. and she does have an appointment with her internal doctor for physical. Patient be seen back in 2 weeks or earlier if any issues should occur  X-ray report indicates there is edema but I did not see signs of stress fracture or arthritic conditions

## 2016-02-14 NOTE — Therapy (Signed)
[place], [place], [address] Phone: [phone]   Fax:  [phone]  Physical Therapy Treatment  Patient Details  Name: Beth King MRN: [id number] Date of Birth: 03-05-1976 Referring Provider: Christena Flake Md  Encounter Date: 02/14/2016      PT End of Session - 02/14/16 1826    Visit Number 6   Number of Visits 12   Date for PT Re-Evaluation 02/22/16   PT Start Time 1416   PT Stop Time 1500   PT Time Calculation (min) 44 min   Activity Tolerance Patient tolerated treatment well   Behavior During Therapy [facility] for tasks assessed/performed      No past medical history on file.  No past surgical history on file.  There were no vitals filed for this visit.  Visit Diagnosis:  Heel pain, bilateral  Abnormality of gait  Ankle weakness  Activity intolerance      Subjective Assessment - 02/14/16 1428    Subjective Saw foot DR,  He ordered study to see if I have clots.     Currently in Pain? Yes   Pain Score 6    Pain Orientation Left;Right   Pain Descriptors / Indicators Burning;Aching   Pain Radiating Towards calves   Pain Frequency Constant   Aggravating Factors  first steps in am., walking   Pain Relieving Factors cold rest                          [facility] Adult PT Treatment/Exercise - 02/14/16 1340    High Level Balance   High Level Balance Comments pertubations tandem compliant and non compliant static and dymanic exercises close SBA ,  unstable   Knee/Hip Exercises: Stretches   Hip Flexor Stretch 2 reps;30 seconds  added to home   Knee/Hip Exercises: Supine   Bridges Limitations over small bolster for more hip isolation and over ball for more core work 10 X each   Straight Leg Raises Limitations added to home exercises   Knee/Hip Exercises: Sidelying   Hip ABduction 10 reps   Hip ADduction 10 reps   Clams 10 X    Knee/Hip Exercises: Prone   Straight Leg Raises  10 reps  Both,  difficult   Straight Leg Raises Limitations very weak 3+/5   Other Prone Exercises Bent knee lifts 3-5 X each difficult   Ankle Exercises: Seated   Other Seated Ankle Exercises Supination. pronation 10 x   Ankle Exercises: Standing   Other Standing Ankle Exercises intrinsic  great toe presses 10 X,  toe lifts with leaving great toe on ground 10 X.                PT Education - 02/14/16 1825    Education provided Yes   Education Details strength/ stretch hip   Person(s) Educated Patient   Methods Explanation;Demonstration;Tactile cues;Verbal cues;Handout   Comprehension Verbalized understanding;Returned demonstration          PT Short Term Goals - 02/08/16 1232    PT SHORT TERM GOAL #1   Title pt will be I with inital HEP (02/01/2016)   Time 3   Period Weeks   Status Achieved   PT SHORT TERM GOAL #2   Title pt will be able to verbalize and demonstrate techniques to reduce bil heel pain and inflammation via RICE(02/01/2016)   Period Weeks   Status Achieved  PT Long Term Goals - 02/14/16 1829    PT LONG TERM GOAL #1   Title pt will be I with all HEP as of last visit ( 02/22/2016)   Time 6   Period Weeks   Status On-going   PT LONG TERM GOAL #2   Title pt will improve bil ankle strength to >/= 4+/5 in all planes to assist with prlonged standing with </=3/10 pain (02/22/2016)   Time 6   Period Weeks   Status Unable to assess   PT LONG TERM GOAL #3   Title pt will be able to stand for >/= 45 minutes with </=3/10 pain to assist with job related activities (02/22/2016)   Baseline painful today 6/10   Time 6   Period Weeks   Status On-going   PT LONG TERM GOAL #4   Title pt will improve her LEFS score by >/=10 points to demonstrate improvement in function (02/22/2016)   Time 6   Period Weeks   Status Unable to assess               Plan - 02/14/16 1826    Clinical Impression Statement Legs swollen, tight shiney.  She is to get  study tomorrow to rule out clots.  Pain unchanged at end of session.  No maNUAL today.  progress toward home exercises.  balance  and hip strength need  work.   PT Next Visit Plan see how Tests for clot went.  Review hip strength and balance, closed chain stretch as able.   PT Home Exercise Plan Hip stretch and strength- SLR 4 way   Consulted and Agree with Plan of Care Patient        Problem List There are no active problems to display for this patient.   [facility] 02/14/2016, 6:31 PM  [facility] [address] [place], [place], [address] Phone: [phone]   Fax:  [phone]  Name: PATRICIAANN LASTRAPES MRN: [id number] Date of Birth: 1976/02/18    Melvenia Needles, PTA 02/14/2016 6:31 PM Phone: [phone] Fax: [phone]

## 2016-02-14 NOTE — Patient Instructions (Signed)
From exercise drawer.  Hip flexer stretch, daily 3 x 30 seconds. Bridge 10 to 30 X 0 to 5 seconds hold 1 X a day Hip 4 way SLR supine 1 X a day 10 to 30 X 0 to 5 seconds.

## 2016-02-15 ENCOUNTER — Encounter: Payer: 59 | Admitting: Physical Therapy

## 2016-02-21 ENCOUNTER — Ambulatory Visit (HOSPITAL_COMMUNITY)
Admission: RE | Admit: 2016-02-21 | Discharge: 2016-02-21 | Disposition: A | Discharge status: Home / Self Care | Payer: 59 | Source: Ambulatory Visit | Attending: Podiatry | Admitting: Podiatry

## 2016-02-21 DIAGNOSIS — R600 Localized edema: Secondary | ICD-10-CM | POA: Insufficient documentation

## 2016-02-26 ENCOUNTER — Encounter: Payer: Self-pay | Admitting: Podiatry

## 2016-02-26 ENCOUNTER — Ambulatory Visit (INDEPENDENT_AMBULATORY_CARE_PROVIDER_SITE_OTHER): Payer: 59 | Admitting: Podiatry

## 2016-02-26 VITALS — BP 105/61 | HR 113 | Resp 16

## 2016-02-26 DIAGNOSIS — M779 Enthesopathy, unspecified: Secondary | ICD-10-CM

## 2016-02-26 MED ORDER — TRIAMCINOLONE ACETONIDE 10 MG/ML IJ SUSP
10.0000 mg | Freq: Once | INTRAMUSCULAR | Status: AC
  Administered 2016-02-26: 10 mg

## 2016-02-27 NOTE — Progress Notes (Signed)
Subjective:     Patient ID: Beth King, female   DOB: [birth year], 40 y.o.   MRN: [id number]  HPI patient states I'm doing a little better but I still have swelling in my feet left over right and I do have pain still noted in my ankle joint bilateral   Review of Systems     Objective:   Physical Exam Neurovascular status intact muscle strength adequate with continued discomfort in the sinus tarsi bilateral with edema and a Doppler that was negative for indications of vein clot    Assessment:     Inflammatory condition with possibility of venous disease which may be part of the patient's pathology with sinus tarsitis noted bilateral    Plan:     Today I injected the sinus tarsi bilateral 3 mg Kenalog 5 mg Xylocaine and dispensed anklet for compression and advised on elevation and if symptoms persist we may need to consider consult for ultrasound of the veins area if any changes were to occur she is to let us know immediately and we will see how this responds over the next several weeks

## 2016-02-28 ENCOUNTER — Ambulatory Visit: Payer: 59 | Admitting: Family Medicine

## 2016-03-13 DIAGNOSIS — G43009 Migraine without aura, not intractable, without status migrainosus: Secondary | ICD-10-CM | POA: Diagnosis not present

## 2016-03-13 DIAGNOSIS — J300 Vasomotor rhinitis: Secondary | ICD-10-CM | POA: Diagnosis not present

## 2016-03-13 DIAGNOSIS — G44221 Chronic tension-type headache, intractable: Secondary | ICD-10-CM | POA: Diagnosis not present

## 2016-03-19 DIAGNOSIS — R946 Abnormal results of thyroid function studies: Secondary | ICD-10-CM | POA: Diagnosis not present

## 2016-03-19 DIAGNOSIS — J302 Other seasonal allergic rhinitis: Secondary | ICD-10-CM | POA: Diagnosis not present

## 2016-03-19 DIAGNOSIS — Z0000 Encounter for general adult medical examination without abnormal findings: Secondary | ICD-10-CM | POA: Diagnosis not present

## 2016-03-19 DIAGNOSIS — Z13220 Encounter for screening for lipoid disorders: Secondary | ICD-10-CM | POA: Diagnosis not present

## 2016-03-19 MED FILL — LEVOCETIRIZINE 5 MG TABLET: 5 | 90 days supply | Qty: 90 | Fill #0

## 2016-03-25 ENCOUNTER — Ambulatory Visit: Payer: 59 | Admitting: Podiatry

## 2016-04-09 ENCOUNTER — Encounter: Payer: Self-pay | Admitting: Endocrinology

## 2016-04-09 ENCOUNTER — Ambulatory Visit (INDEPENDENT_AMBULATORY_CARE_PROVIDER_SITE_OTHER): Payer: 59 | Admitting: Endocrinology

## 2016-04-09 VITALS — BP 116/64 | HR 102 | Temp 98.2°F | Ht 64.0 in | Wt 172.0 lb

## 2016-04-09 DIAGNOSIS — G43909 Migraine, unspecified, not intractable, without status migrainosus: Secondary | ICD-10-CM | POA: Insufficient documentation

## 2016-04-09 DIAGNOSIS — G43809 Other migraine, not intractable, without status migrainosus: Secondary | ICD-10-CM

## 2016-04-09 DIAGNOSIS — E0590 Thyrotoxicosis, unspecified without thyrotoxic crisis or storm: Secondary | ICD-10-CM | POA: Diagnosis not present

## 2016-04-09 DIAGNOSIS — J309 Allergic rhinitis, unspecified: Secondary | ICD-10-CM

## 2016-04-09 MED ORDER — METHIMAZOLE 10 MG PO TABS: 20.0000 mg | ORAL_TABLET | Freq: Two times a day (BID) | ORAL | Status: DC

## 2016-04-09 MED FILL — methIMAzole 10 MG TABS: 10 | 30 days supply | Qty: 120 | Fill #0

## 2016-04-09 NOTE — Patient Instructions (Addendum)
i have sent a prescription to your pharmacy, to slow the thyroid.  if ever you have fever while taking methimazole, stop it and call us, even if the reason is obvious, because of the risk of a rare side-effect.  In view of your medical condition, you should avoid pregnancy until we have decided it is safe.   Please come back for a follow-up appointment in 1 month.

## 2016-04-09 NOTE — Progress Notes (Signed)
Subjective:    Patient ID: Beth King, female    DOB: [birth year], 40 y.o.   MRN: [id number]  HPI Pt reports 2 mos of moderate palpitations in the chest, and assoc weight loss.  she has never been on thyroid  therapy.  she has never had XRT to the anterior neck, or thyroid surgery.  she has never had thyroid imaging.  she does not consume kelp or any other prescribed or non-prescribed thyroid medication.  She has IUD.   Past Medical History  Diagnosis Date  . Hyperthyroidism   . Migraine   . Allergic rhinitis     No past surgical history on file.  Social History   Social History  . Marital Status: Married    Spouse Name: N/A  . Number of Children: N/A  . Years of Education: N/A   Occupational History  . Not on file.   Social History Main Topics  . Smoking status: Never Smoker   . Smokeless tobacco: Not on file  . Alcohol Use: Yes  . Drug Use: No  . Sexual Activity: Not on file   Other Topics Concern  . Not on file   Social History Narrative    Current Outpatient Prescriptions on File Prior to Visit  Medication Sig Dispense Refill  . diclofenac (VOLTAREN) 75 MG EC tablet Take 1 tablet (75 mg total) by mouth 2 (two) times daily. 50 tablet 2  . ibuprofen (ADVIL,MOTRIN) 200 MG tablet Take 200 mg by mouth every 6 (six) hours as needed.    Marland Kitchen levocetirizine (XYZAL) 5 MG tablet Take 5 mg by mouth every evening. Reported on 01/11/2016    . Multiple Vitamin (MULTIVITAMIN WITH MINERALS) TABS Take 1 tablet by mouth daily.    Marland Kitchen topiramate (TOPAMAX) 25 MG tablet Take 25 mg by mouth 2 (two) times daily.    . magnesium oxide (MAG-OX) 400 MG tablet Take 400 mg by mouth daily. Reported on 04/09/2016    . Omega-3 Fatty Acids (FISH OIL) 1200 MG CAPS Take by mouth. Reported on 04/09/2016     No current facility-administered medications on file prior to visit.    No Known Allergies  Family History  Problem Relation Age of Onset  . Thyroid disease Neg Hx     BP 116/64 mmHg  Pulse  102  Temp(Src) 98.2 F (36.8 C) (Oral)  Ht 5\' 4"  (1.626 m)  Wt 172 lb (78.019 kg)  BMI 29.51 kg/m2  SpO2 97%   Review of Systems denies fever, hoarseness, visual loss, sob, diarrhea, polyuria, muscle weakness, excessive diaphoresis, tremor, anxiety, easy bruising, and rhinorrhea.  She has fatigue, heat intolerance, and leg edema.  No change in chronic headache.       Objective:   Physical Exam VS: see vs page GEN: no distress HEAD: head: no deformity eyes: no periorbital swelling, but there is slight bilat proptosis external nose and ears are normal mouth: no lesion seen NECK: thyroid is 3 times normal size, diffuse CHEST WALL: no deformity LUNGS: clear to auscultation CV: tachycardic rate, but reg rhythm, no murmur ABD: abdomen is soft, nontender.  no hepatosplenomegaly.  not distended.  no hernia MUSCULOSKELETAL: muscle bulk and strength are grossly normal.  no obvious joint swelling.  gait is normal and steady EXTEMITIES: no deformity.  1+ bilat leg edema PULSES: no carotid bruit NEURO:  cn 2-12 grossly intact.   readily moves all 4's.  sensation is intact to touch on all 4's SKIN:  Normal texture and temperature.  No rash or suspicious lesion is visible.   NODES:  None palpable at the neck PSYCH: alert, well-oriented.  Does not appear anxious nor depressed.  outside test results are reviewed: TSH is undectable Free T4=2.83  I have reviewed outside records, and summarized: Pt was noted to have hyperthyroidism, and referred here.  CXR: (2014): no mention is made of a goiter    Assessment & Plan:  Hyperthyroidism, new, prob due to [place] dz.  We discussed rx options: she chooses tapazole.  Patient is advised the following: Patient Instructions  i have sent a prescription to your pharmacy, to slow the thyroid.  if ever you have fever while taking methimazole, stop it and call us, even if the reason is obvious, because of the risk of a rare side-effect.  In view of  your medical condition, you should avoid pregnancy until we have decided it is safe.   Please come back for a follow-up appointment in 1 month.

## 2016-04-29 DIAGNOSIS — H5213 Myopia, bilateral: Secondary | ICD-10-CM | POA: Diagnosis not present

## 2016-04-29 DIAGNOSIS — H524 Presbyopia: Secondary | ICD-10-CM | POA: Diagnosis not present

## 2016-04-29 DIAGNOSIS — H52221 Regular astigmatism, right eye: Secondary | ICD-10-CM | POA: Diagnosis not present

## 2016-05-10 ENCOUNTER — Encounter: Payer: Self-pay | Admitting: Endocrinology

## 2016-05-10 ENCOUNTER — Ambulatory Visit (INDEPENDENT_AMBULATORY_CARE_PROVIDER_SITE_OTHER): Payer: 59 | Admitting: Endocrinology

## 2016-05-10 VITALS — BP 128/60 | HR 105 | Temp 98.8°F | Ht 64.0 in | Wt 174.0 lb

## 2016-05-10 DIAGNOSIS — E0590 Thyrotoxicosis, unspecified without thyrotoxic crisis or storm: Secondary | ICD-10-CM

## 2016-05-10 LAB — TSH: TSH: 0.05 u[IU]/mL — ABNORMAL LOW (ref 0.35–4.50)

## 2016-05-10 LAB — T4, FREE: Free T4: 1.12 ng/dL (ref 0.60–1.60)

## 2016-05-10 NOTE — Progress Notes (Signed)
   Subjective:    Patient ID: Beth King, female    DOB: 1976-11-03, 40 y.o.   MRN: [id number]  HPI  Pt returns for f/u of hyperthyroidism (dx'ed 2017; prob due to [place] dz, but she has never had thyroid imaging; she has IUD; she chose tapazole rx).  Since on tapazole, pt states she feels better in general.  Specifically, palpitations are less now.    Review of Systems Denies fever.     Objective:   Physical Exam VITAL SIGNS:  See vs page GENERAL: no distress NECK: thyroid is 2-3 times normal size (R>L), but no palpable nodule  Lab Results  Component Value Date   TSH 0.05* 05/10/2016      Assessment & Plan:  Hyperthyroidism: much better: reduce tapazole to 10 mg/d  Patient is advised the following: Patient Instructions  blood tests are requested for you today.  We'll let you know about the results. if ever you have fever while taking methimazole, stop it and call us, even if the reason is obvious, because of the risk of a rare side-effect. Please come back for a follow-up appointment in 3 months.     Renato Shin, MD

## 2016-05-10 NOTE — Patient Instructions (Signed)
blood tests are requested for you today.  We'll let you know about the results. if ever you have fever while taking methimazole, stop it and call us, even if the reason is obvious, because of the risk of a rare side-effect.   Please come back for a follow-up appointment in 3 months.  

## 2016-05-11 MED ORDER — METHIMAZOLE 10 MG PO TABS: 10.0000 mg | ORAL_TABLET | Freq: Every day | ORAL | Status: DC

## 2016-05-13 MED FILL — methIMAzole 10 MG TABS: 10 | 30 days supply | Qty: 30 | Fill #0

## 2016-06-17 DIAGNOSIS — M2550 Pain in unspecified joint: Secondary | ICD-10-CM | POA: Diagnosis not present

## 2016-06-18 DIAGNOSIS — G44221 Chronic tension-type headache, intractable: Secondary | ICD-10-CM | POA: Diagnosis not present

## 2016-06-18 DIAGNOSIS — G4440 Drug-induced headache, not elsewhere classified, not intractable: Secondary | ICD-10-CM | POA: Diagnosis not present

## 2016-06-18 DIAGNOSIS — G43019 Migraine without aura, intractable, without status migrainosus: Secondary | ICD-10-CM | POA: Diagnosis not present

## 2016-08-09 ENCOUNTER — Ambulatory Visit: Payer: 59 | Admitting: Endocrinology

## 2016-08-27 ENCOUNTER — Ambulatory Visit (INDEPENDENT_AMBULATORY_CARE_PROVIDER_SITE_OTHER): Payer: 59 | Admitting: Endocrinology

## 2016-08-27 ENCOUNTER — Encounter: Payer: Self-pay | Admitting: Endocrinology

## 2016-08-27 VITALS — BP 122/74 | HR 86 | Ht 64.0 in | Wt 179.0 lb

## 2016-08-27 DIAGNOSIS — E0590 Thyrotoxicosis, unspecified without thyrotoxic crisis or storm: Secondary | ICD-10-CM | POA: Diagnosis not present

## 2016-08-27 LAB — T4, FREE: Free T4: 1.19 ng/dL (ref 0.60–1.60)

## 2016-08-27 LAB — TSH: TSH: 0.61 u[IU]/mL (ref 0.35–4.50)

## 2016-08-27 NOTE — Patient Instructions (Signed)
blood tests are requested for you today.  We'll let you know about the results.   if ever you have fever while taking methimazole, stop it and call us, even if the reason is obvious, because of the risk of a rare side-effect.   Please come back for a follow-up appointment in 4 months.  

## 2016-08-27 NOTE — Progress Notes (Signed)
   Subjective:    Patient ID: Beth King, female    DOB: 13-Oct-1976, 40 y.o.   MRN: [id number]  HPI Pt returns for f/u of hyperthyroidism (dx'ed 2017; prob due to [place] dz, but she has never had thyroid imaging; she has IUD; she chose tapazole rx).  Since on tapazole, pt states she feels better in general.  Specifically, palpitations are less now. Past Medical History:  Diagnosis Date  . Allergic rhinitis   . Hyperthyroidism   . Migraine     No past surgical history on file.  Social History   Social History  . Marital status: Married    Spouse name: N/A  . Number of children: N/A  . Years of education: N/A   Occupational History  . Not on file.   Social History Main Topics  . Smoking status: Never Smoker  . Smokeless tobacco: Not on file  . Alcohol use Yes  . Drug use: No  . Sexual activity: Not on file   Other Topics Concern  . Not on file   Social History Narrative  . No narrative on file    Current Outpatient Prescriptions on File Prior to Visit  Medication Sig Dispense Refill  . ibuprofen (ADVIL,MOTRIN) 200 MG tablet Take 200 mg by mouth every 6 (six) hours as needed.    Marland Kitchen levocetirizine (XYZAL) 5 MG tablet Take 5 mg by mouth every evening. Reported on 01/11/2016    . methimazole (TAPAZOLE) 10 MG tablet Take 1 tablet (10 mg total) by mouth daily. 30 tablet 3  . Multiple Vitamin (MULTIVITAMIN WITH MINERALS) TABS Take 1 tablet by mouth daily.    Marland Kitchen topiramate (TOPAMAX) 25 MG tablet Take 25 mg by mouth 2 (two) times daily.     No current facility-administered medications on file prior to visit.     No Known Allergies  Family History  Problem Relation Age of Onset  . Thyroid disease Neg Hx     BP 122/74   Pulse 86   Ht 5\' 4"  (1.626 m)   Wt 179 lb (81.2 kg)   SpO2 97%   BMI 30.73 kg/m    Review of Systems Denies fever.     Objective:   Physical Exam VITAL SIGNS:  See vs page.  GENERAL: no distress.  NECK: thyroid is twice normal size (R>L),  but no palpable nodule.  Skin: not diaphoretic.  Neuro: no tremor.    Lab Results  Component Value Date   TSH 0.61 08/27/2016      Assessment & Plan:  Hyperthyroidism: well-controlled: Please continue the same medication.

## 2016-09-11 MED FILL — methIMAzole 10 MG TABS: 10 | 30 days supply | Qty: 30 | Fill #1

## 2016-10-01 DIAGNOSIS — R12 Heartburn: Secondary | ICD-10-CM | POA: Diagnosis not present

## 2016-10-01 DIAGNOSIS — G44221 Chronic tension-type headache, intractable: Secondary | ICD-10-CM | POA: Diagnosis not present

## 2016-10-01 DIAGNOSIS — G43009 Migraine without aura, not intractable, without status migrainosus: Secondary | ICD-10-CM | POA: Diagnosis not present

## 2016-10-08 MED FILL — KETOPROFEN 75 MG CAPSULE: 75 | 30 days supply | Qty: 40 | Fill #0

## 2016-10-15 DIAGNOSIS — M722 Plantar fascial fibromatosis: Secondary | ICD-10-CM | POA: Insufficient documentation

## 2016-10-15 DIAGNOSIS — M2550 Pain in unspecified joint: Secondary | ICD-10-CM | POA: Insufficient documentation

## 2016-10-15 NOTE — Progress Notes (Signed)
*IMAGE* Office Visit Note  Patient: Beth King             Date of Birth: 1976-03-11           MRN: [id number]             PCP: Lynne Logan, MD Referring: Donald Prose, MD Visit Date: 10/17/2016 Occupation: Field seismologist. at [employer]    Subjective:  Arthralgias   History of Present Illness: Beth King is a right-handed 40 y.o. female. She states her symptoms is started and of last year with bilateral plantar fasciitis for which she's been seeing a podiatrist. She underwent physical therapy and cortisone injections. She states her symptoms did not completely resolve. She still continues to have some discomfort in her bilateral feet due to plantar fasciitis. Due to these symptoms she's been walking awkward and it was a strain on her lower back and her neck. She states she's been having discomfort in her neck and lower back her bilateral knees and her bilateral shoulders. She denies any joint swelling she has morning stiffness lasting for about 10 minutes she has not nocturnal pain in her lower back at times.    Activities of Daily Living:  Patient reports morning stiffness for 10 minutes.   Patient Reports nocturnal pain.  Difficulty dressing/grooming: Denies Difficulty climbing stairs: Reports Difficulty getting out of chair: Denies Difficulty using hands for taps, buttons, cutlery, and/or writing: Denies   Review of Systems  Constitutional: Negative for fatigue, night sweats, weight gain, weight loss and weakness.  HENT: Negative for mouth sores, trouble swallowing, trouble swallowing, mouth dryness and nose dryness.   Eyes: Negative for pain, redness, visual disturbance and dryness.  Respiratory: Negative for cough, shortness of breath and difficulty breathing.   Cardiovascular: Negative for chest pain, palpitations, hypertension, irregular heartbeat and swelling in legs/feet.  Gastrointestinal: Negative for blood in stool, constipation and diarrhea.  Endocrine: Negative for  increased urination.  Genitourinary: Negative for vaginal dryness.  Musculoskeletal: Positive for arthralgias, joint pain and morning stiffness. Negative for joint swelling, myalgias, muscle weakness, muscle tenderness and myalgias.  Skin: Negative for color change, rash, hair loss, skin tightness, ulcers and sensitivity to sunlight.  Allergic/Immunologic: Negative for susceptible to infections.  Neurological: Negative for dizziness, memory loss and night sweats.  Hematological: Negative for swollen glands.  Psychiatric/Behavioral: Negative for depressed mood and sleep disturbance. The patient is not nervous/anxious.     PMFS History:  Patient Active Problem List   Diagnosis Date Noted  . Arthralgia 10/15/2016  . Plantar fasciitis 10/15/2016  . Hyperthyroidism   . Migraine   . Allergic rhinitis     Past Medical History:  Diagnosis Date  . Allergic rhinitis   . Hyperthyroidism   . Migraine     Family History  Problem Relation Age of Onset  . Arthritis Father   . Diabetes Other   . Hypertension Other   . Thyroid disease Neg Hx    History reviewed. No pertinent surgical history. Social History   Social History Narrative  . No narrative on file     Objective: Vital Signs: BP 131/75 (BP Location: Right Arm, Patient Position: Sitting, Cuff Size: Large)   Pulse 95   Resp 16   Ht '5\' 4"'  (1.626 m)   Wt 188 lb (85.3 kg)   BMI 32.27 kg/m    Physical Exam  Constitutional: She is oriented to person, place, and time. She appears well-developed and well-nourished.  HENT:  Head: Normocephalic and atraumatic.  Eyes: Conjunctivae and EOM are normal.  Neck: Normal range of motion.  Cardiovascular: Normal rate, regular rhythm, normal heart sounds and intact distal pulses.   Pulmonary/Chest: Effort normal and breath sounds normal.  Abdominal: Soft. Bowel sounds are normal.  Lymphadenopathy:    She has no cervical adenopathy.  Neurological: She is alert and oriented to person,  place, and time.  Skin: Skin is warm and dry. Capillary refill takes less than 2 seconds.  Psychiatric: She has a normal mood and affect. Her behavior is normal.  Nursing note and vitals reviewed.    Musculoskeletal Exam: C-spine good range of motion with some discomfort, thoracic spine good range of motion, lumbar spine good range of motion with discomfort, she has tenderness on palpation over bilateral SI joint. Shoulder joints, elbow joints, wrist joints, MCPs PIPs DIPs with good range of motion with no synovitis. Hip joints, knee joints, ankle joints are good range of motion. She had tenderness on palpation over her ankle joints but no swelling or synovitis was noted. She has no tenderness or MTPs PIPs or DIPs. She has pes planus and tenderness over plantar fascia.  CDAI Exam: No CDAI exam completed.    Investigation: Findings:  06/17/2016 CBC normal, ESR 30, ANA negative, RF negative, April 11 17 CMP glucose 120 ALD 69, TSH less than 0.01    Imaging: No results found.  Speciality Comments: No specialty comments available.    Procedures:  No procedures performed Allergies: Patient has no known allergies.   Assessment / Plan: Visit Diagnoses: Arthralgia, she has had long-standing history of some plantar fasciitis, she believes due to walking off work is putting strain on her lower back and SI joints she has discomfort in her C-spine as well. I will obtain x-rays of her lumbar spine SI joints and bilateral feet. We will also obtain following labs to look for any underlying inflammatory causes. - ESR 30, RF negative, ANA negative - Plan: CBC with Differential/Platelet, COMPLETE METABOLIC PANEL WITH GFR, Sedimentation rate, CK, HLA-B27 antigen, ANA, Rheumatoid factor, Cyclic citrul peptide antibody, IgG, Angiotensin converting enzyme  Plantar fasciitis: Need for proper fitting orthotics was discussed. She has pes planus which complicates the picture.  Migraine without status  migrainosus, not intractable, unspecified migraine type: Followed up by her PCP  Seasonal allergic rhinitis, unspecified chronicity, unspecified trigger: Followed by PCP  Chronic low back pain, unspecified back pain laterality, with sciatica presence unspecified - Plan: XR Pelvis 1-2 Views, XR Lumbar Spine 2-3 Views  Pain in both feet - Plan: XR Foot 2 Views Right, XR Foot 2 Views Left    Orders: Orders Placed This Encounter  Procedures  . XR Pelvis 1-2 Views  . XR Foot 2 Views Right  . XR Foot 2 Views Left  . XR Lumbar Spine 2-3 Views  . CBC with Differential/Platelet  . COMPLETE METABOLIC PANEL WITH GFR  . Sedimentation rate  . CK  . HLA-B27 antigen  . ANA  . Rheumatoid factor  . Cyclic citrul peptide antibody, IgG  . Angiotensin converting enzyme   Meds ordered this encounter  Medications  . ketoprofen (ORUDIS) 50 MG capsule    Sig: Take 50 mg by mouth 3 (three) times daily. For three days  . levonorgestrel (MIRENA) 20 MCG/24HR IUD    Sig: 1 each by Intrauterine route once.    Face-to-face time spent with patient was 38mnutes. 50% of time was spent in counseling and coordination of care.  Follow-Up Instructions: Next available new patient follow-up  Bo Merino, MD, [facility] Payer

## 2016-10-17 ENCOUNTER — Ambulatory Visit (INDEPENDENT_AMBULATORY_CARE_PROVIDER_SITE_OTHER): Payer: Self-pay

## 2016-10-17 ENCOUNTER — Encounter: Payer: Self-pay | Admitting: Rheumatology

## 2016-10-17 ENCOUNTER — Ambulatory Visit (INDEPENDENT_AMBULATORY_CARE_PROVIDER_SITE_OTHER): Payer: 59 | Admitting: Rheumatology

## 2016-10-17 ENCOUNTER — Telehealth: Payer: Self-pay | Admitting: Radiology

## 2016-10-17 VITALS — BP 131/75 | HR 95 | Resp 16 | Ht 64.0 in | Wt 188.0 lb

## 2016-10-17 DIAGNOSIS — G8929 Other chronic pain: Secondary | ICD-10-CM | POA: Diagnosis not present

## 2016-10-17 DIAGNOSIS — M545 Low back pain: Secondary | ICD-10-CM

## 2016-10-17 DIAGNOSIS — G43909 Migraine, unspecified, not intractable, without status migrainosus: Secondary | ICD-10-CM

## 2016-10-17 DIAGNOSIS — M79672 Pain in left foot: Secondary | ICD-10-CM

## 2016-10-17 DIAGNOSIS — M79671 Pain in right foot: Secondary | ICD-10-CM

## 2016-10-17 DIAGNOSIS — M722 Plantar fascial fibromatosis: Secondary | ICD-10-CM | POA: Diagnosis not present

## 2016-10-17 DIAGNOSIS — J302 Other seasonal allergic rhinitis: Secondary | ICD-10-CM | POA: Diagnosis not present

## 2016-10-17 DIAGNOSIS — M2550 Pain in unspecified joint: Secondary | ICD-10-CM | POA: Diagnosis not present

## 2016-10-17 LAB — CBC WITH DIFFERENTIAL/PLATELET
Basophils Absolute: 0 cells/uL (ref 0–200)
Basophils Relative: 0 %
Eosinophils Absolute: 390 cells/uL (ref 15–500)
Eosinophils Relative: 6 %
HCT: 40.1 % (ref 35.0–45.0)
Hemoglobin: 13.3 g/dL (ref 11.7–15.5)
Lymphocytes Relative: 35 %
Lymphs Abs: 2275 cells/uL (ref 850–3900)
MCH: 27.9 pg (ref 27.0–33.0)
MCHC: 33.2 g/dL (ref 32.0–36.0)
MCV: 84.1 fL (ref 80.0–100.0)
MPV: 9.4 fL (ref 7.5–12.5)
Monocytes Absolute: 520 cells/uL (ref 200–950)
Monocytes Relative: 8 %
Neutro Abs: 3315 cells/uL (ref 1500–7800)
Neutrophils Relative %: 51 %
Platelets: 333 10*3/uL (ref 140–400)
RBC: 4.77 MIL/uL (ref 3.80–5.10)
RDW: 15.3 % — ABNORMAL HIGH (ref 11.0–15.0)
WBC: 6.5 10*3/uL (ref 3.8–10.8)

## 2016-10-17 NOTE — Telephone Encounter (Signed)
Dr Estanislado Pandy has had me advise patient xrays normal Sent her a my chart message.

## 2016-10-18 LAB — RHEUMATOID FACTOR: Rhuematoid fact SerPl-aCnc: <14 IU/mL (ref <14)

## 2016-10-18 LAB — COMPLETE METABOLIC PANEL WITH GFR
ALT: 62 U/L — ABNORMAL HIGH (ref 6–29)
AST: 22 U/L (ref 10–30)
Albumin: 3.9 g/dL (ref 3.6–5.1)
Alkaline Phosphatase: 158 U/L — ABNORMAL HIGH (ref 33–115)
BUN: 8 mg/dL (ref 7–25)
CO2: 22 mmol/L (ref 20–31)
Calcium: 9.4 mg/dL (ref 8.6–10.2)
Chloride: 102 mmol/L (ref 98–110)
Creat: 0.54 mg/dL (ref 0.50–1.10)
GFR, Est African American: >89 mL/min (ref >=60)
GFR, Est Non African American: >89 mL/min (ref >=60)
Glucose, Bld: 105 mg/dL — ABNORMAL HIGH (ref 65–99)
Potassium: 4 mmol/L (ref 3.5–5.3)
Sodium: 137 mmol/L (ref 135–146)
Total Bilirubin: 0.3 mg/dL (ref 0.2–1.2)
Total Protein: 6.8 g/dL (ref 6.1–8.1)

## 2016-10-18 LAB — ANA: Anti Nuclear Antibody(ANA): NEGATIVE

## 2016-10-18 LAB — ANGIOTENSIN CONVERTING ENZYME: Angiotensin-Converting Enzyme: 35 U/L (ref 9–67)

## 2016-10-18 LAB — SEDIMENTATION RATE: Sed Rate: 14 mm/hr (ref 0–20)

## 2016-10-18 LAB — CK: Total CK: 96 U/L (ref 7–177)

## 2016-10-18 LAB — CYCLIC CITRUL PEPTIDE ANTIBODY, IGG: Cyclic Citrullin Peptide Ab: <16 Units

## 2016-10-25 LAB — HLA-B27 ANTIGEN: DNA Result:: NEGATIVE

## 2016-10-29 DIAGNOSIS — D259 Leiomyoma of uterus, unspecified: Secondary | ICD-10-CM | POA: Diagnosis not present

## 2016-10-29 DIAGNOSIS — Z01411 Encounter for gynecological examination (general) (routine) with abnormal findings: Secondary | ICD-10-CM | POA: Diagnosis not present

## 2016-10-29 DIAGNOSIS — Z6832 Body mass index (BMI) 32.0-32.9, adult: Secondary | ICD-10-CM | POA: Diagnosis not present

## 2016-10-29 DIAGNOSIS — Z3040 Encounter for surveillance of contraceptives, unspecified: Secondary | ICD-10-CM | POA: Diagnosis not present

## 2016-11-21 NOTE — Progress Notes (Deleted)
Office Visit Note  Patient: Beth King             Date of Birth: [birth year]           MRN: [id number]             PCP: Lynne Logan, MD Referring: Donald Prose, MD Visit Date: 11/26/2016 Occupation: _0 @    Subjective:  No chief complaint on file.   History of Present Illness: Beth King is a 40 y.o. female ***   Activities of Daily Living:  Patient reports morning stiffness for *** {minute/hour:19697}.   Patient {ACTIONS;DENIES/REPORTS:21021675::"Denies"} nocturnal pain.  Difficulty dressing/grooming: {ACTIONS;DENIES/REPORTS:21021675::"Denies"} Difficulty climbing stairs: {ACTIONS;DENIES/REPORTS:21021675::"Denies"} Difficulty getting out of chair: {ACTIONS;DENIES/REPORTS:21021675::"Denies"} Difficulty using hands for taps, buttons, cutlery, and/or writing: {ACTIONS;DENIES/REPORTS:21021675::"Denies"}   No Rheumatology ROS completed.   PMFS History:  Patient Active Problem List   Diagnosis Date Noted  . Arthralgia 10/15/2016  . Plantar fasciitis 10/15/2016  . Hyperthyroidism   . Migraine   . Allergic rhinitis     Past Medical History:  Diagnosis Date  . Allergic rhinitis   . Hyperthyroidism   . Migraine     Family History  Problem Relation Age of Onset  . Arthritis Father   . Diabetes Other   . Hypertension Other   . Thyroid disease Neg Hx    No past surgical history on file. Social History   Social History Narrative  . No narrative on file     Objective: Vital Signs: There were no vitals taken for this visit.   Physical Exam   Musculoskeletal Exam: ***  CDAI Exam: No CDAI exam completed.    Investigation: Findings:  06/17/2016 CBC normal, ESR 30, ANA negative, RF negative, April 11 17 CMP glucose 120 ALD 69, TSH less than 0.01   Office Visit on 10/17/2016  Component Date Value Ref Range Status  . WBC 10/17/2016 6.5  3.8 - 10.8 K/uL Final  . RBC 10/17/2016 4.77  3.80 - 5.10 MIL/uL Final  . Hemoglobin 10/17/2016 13.3  11.7 -  15.5 g/dL Final  . HCT 10/17/2016 40.1  35.0 - 45.0 % Final  . MCV 10/17/2016 84.1  80.0 - 100.0 fL Final  . MCH 10/17/2016 27.9  27.0 - 33.0 pg Final  . MCHC 10/17/2016 33.2  32.0 - 36.0 g/dL Final  . RDW 10/17/2016 15.3* 11.0 - 15.0 % Final  . Platelets 10/17/2016 333  140 - 400 K/uL Final  . MPV 10/17/2016 9.4  7.5 - 12.5 fL Final  . Neutro Abs 10/17/2016 3315  1,500 - 7,800 cells/uL Final  . Lymphs Abs 10/17/2016 2275  850 - 3,900 cells/uL Final  . Monocytes Absolute 10/17/2016 520  200 - 950 cells/uL Final  . Eosinophils Absolute 10/17/2016 390  15 - 500 cells/uL Final  . Basophils Absolute 10/17/2016 0  0 - 200 cells/uL Final  . Neutrophils Relative % 10/17/2016 51  % Final  . Lymphocytes Relative 10/17/2016 35  % Final  . Monocytes Relative 10/17/2016 8  % Final  . Eosinophils Relative 10/17/2016 6  % Final  . Basophils Relative 10/17/2016 0  % Final  . Smear Review 10/17/2016 Criteria for review not met   Final  . Sodium 10/18/2016 137  135 - 146 mmol/L Final  . Potassium 10/18/2016 4.0  3.5 - 5.3 mmol/L Final  . Chloride 10/18/2016 102  98 - 110 mmol/L Final  . CO2 10/18/2016 22  20 - 31 mmol/L Final  . Glucose, Bld 10/18/2016 105* 65 -  99 mg/dL Final  . BUN 10/18/2016 8  7 - 25 mg/dL Final  . Creat 10/18/2016 0.54  0.50 - 1.10 mg/dL Final  . Total Bilirubin 10/18/2016 0.3  0.2 - 1.2 mg/dL Final  . Alkaline Phosphatase 10/18/2016 158* 33 - 115 U/L Final  . AST 10/18/2016 22  10 - 30 U/L Final  . ALT 10/18/2016 62* 6 - 29 U/L Final  . Total Protein 10/18/2016 6.8  6.1 - 8.1 g/dL Final  . Albumin 10/18/2016 3.9  3.6 - 5.1 g/dL Final  . Calcium 10/18/2016 9.4  8.6 - 10.2 mg/dL Final  . GFR, Est African American 10/18/2016 >89  >=60 mL/min Final  . GFR, Est Non African American 10/18/2016 >89  >=60 mL/min Final  . Sed Rate 10/18/2016 14  0 - 20 mm/hr Final  . Total CK 10/18/2016 96  7 - 177 U/L Final  . DNA Result: 10/25/2016 Negative  Negative Final  . Results reviewed by:  10/25/2016 REPORT   Final   Comment: Karlyne Greenspan, Ph.D.,[facility] Director, Molecular Genetics The B27 allele group of the HLA-B locus is present in 2 to 9% of the general population.  About 20% of HLA-B27 carriers develop autoimmune disorders including Ankylosing Spondylitis (AS), Reactive Arthritis, Psoriatic Arthritis, Undifferentiated Oligoarthritis, Uveitis, or Inflammatory Bowel Disease. The highest association is with AS, where approximately 95% of AS patients are HLA-B27 positive. Genetic counseling as needed. Typing performed by PCR and hybridization with sequence specific oligonucleotide probes (SSO) using the FDA-cleared LABType(R) SSO Kit.   . Anit Nuclear Antibody(ANA) 10/18/2016 NEG  NEGATIVE Final  . Rhuematoid fact SerPl-aCnc 10/18/2016 <14  <14 IU/mL Final  . Cyclic Citrullin Peptide Ab 10/18/2016 <16  Units Final   Comment:   Reference Range Negative               < 20 Weak Positive            20 - 39 Moderate Positive        40 - 59 Strong Positive        > 59   . Angiotensin-Converting Enzyme 10/18/2016 35  9 - 67 U/L Final   Comment: ** Please note change in reference range(s). **      Imaging: Xrays Pelvis, Lumbar 119/17 normal bilateral feet normal except for heel spurs noted bilateral.  Speciality Comments: No specialty comments available.    Procedures:  No procedures performed Allergies: Patient has no known allergies.   Assessment / Plan:     Visit Diagnoses: Arthralgia, unspecified joint  Plantar fasciitis  History of migraine  Chronic bilateral low back pain without sciatica  Pain in both feet    Orders: No orders of the defined types were placed in this encounter.  No orders of the defined types were placed in this encounter.   Face-to-face time spent with patient was *** minutes. 50% of time was spent in counseling and coordination of care.  Follow-Up Instructions: No Follow-up on file.   Kyal Arts, RT

## 2016-11-26 ENCOUNTER — Ambulatory Visit: Payer: 59 | Admitting: Rheumatology

## 2016-12-27 ENCOUNTER — Ambulatory Visit: Payer: 59 | Admitting: Endocrinology

## 2017-01-02 MED FILL — methIMAzole 10 MG TABS: 10 | 30 days supply | Qty: 30 | Fill #2

## 2017-01-17 DIAGNOSIS — G43009 Migraine without aura, not intractable, without status migrainosus: Secondary | ICD-10-CM | POA: Diagnosis not present

## 2017-01-17 DIAGNOSIS — G44219 Episodic tension-type headache, not intractable: Secondary | ICD-10-CM | POA: Diagnosis not present

## 2017-02-03 DIAGNOSIS — D259 Leiomyoma of uterus, unspecified: Secondary | ICD-10-CM | POA: Diagnosis not present

## 2017-02-03 DIAGNOSIS — M722 Plantar fascial fibromatosis: Secondary | ICD-10-CM | POA: Diagnosis not present

## 2017-02-18 ENCOUNTER — Telehealth (INDEPENDENT_AMBULATORY_CARE_PROVIDER_SITE_OTHER): Payer: Self-pay | Admitting: Rheumatology

## 2017-02-18 NOTE — Telephone Encounter (Signed)
10/17/2016 PROGRESS NOTE FAXED TO [facility] @ [facility] [phone] ( REFERRING OFFICE)

## 2017-02-28 MED FILL — methIMAzole 10 MG TABS: 10 | 30 days supply | Qty: 30 | Fill #3

## 2017-03-21 ENCOUNTER — Other Ambulatory Visit: Payer: Self-pay

## 2017-03-21 MED ORDER — METHIMAZOLE 10 MG PO TABS: 10.0000 mg | ORAL_TABLET | Freq: Every day | ORAL | 3 refills | Status: DC

## 2017-03-24 DIAGNOSIS — Z0000 Encounter for general adult medical examination without abnormal findings: Secondary | ICD-10-CM | POA: Diagnosis not present

## 2017-03-24 DIAGNOSIS — E0590 Thyrotoxicosis, unspecified without thyrotoxic crisis or storm: Secondary | ICD-10-CM | POA: Diagnosis not present

## 2017-03-24 DIAGNOSIS — J302 Other seasonal allergic rhinitis: Secondary | ICD-10-CM | POA: Diagnosis not present

## 2017-03-24 DIAGNOSIS — Z13220 Encounter for screening for lipoid disorders: Secondary | ICD-10-CM | POA: Diagnosis not present

## 2017-03-24 DIAGNOSIS — Z7189 Other specified counseling: Secondary | ICD-10-CM | POA: Diagnosis not present

## 2017-03-24 MED FILL — ATOVAQUONE-PROGUANIL 250-10: 250-100 | 45 days supply | Qty: 45 | Fill #0

## 2017-03-24 MED FILL — methIMAzole 10 MG TABS: 10 | 30 days supply | Qty: 30 | Fill #0

## 2017-04-22 MED FILL — methIMAzole 10 MG TABS: 10 | 30 days supply | Qty: 30 | Fill #1

## 2017-06-09 ENCOUNTER — Ambulatory Visit (INDEPENDENT_AMBULATORY_CARE_PROVIDER_SITE_OTHER): Payer: 59 | Admitting: Endocrinology

## 2017-06-09 ENCOUNTER — Encounter: Payer: Self-pay | Admitting: Endocrinology

## 2017-06-09 VITALS — BP 122/82 | HR 80 | Ht 64.0 in | Wt 193.0 lb

## 2017-06-09 DIAGNOSIS — E0590 Thyrotoxicosis, unspecified without thyrotoxic crisis or storm: Secondary | ICD-10-CM

## 2017-06-09 LAB — T4, FREE: Free T4: 0.68 ng/dL (ref 0.60–1.60)

## 2017-06-09 LAB — TSH: TSH: 0.54 u[IU]/mL (ref 0.35–4.50)

## 2017-06-09 NOTE — Patient Instructions (Signed)
blood tests are requested for you today.  We'll let you know about the results.   if ever you have fever while taking methimazole, stop it and call us, even if the reason is obvious, because of the risk of a rare side-effect.   Please come back for a follow-up appointment in 4 months.

## 2017-06-09 NOTE — Progress Notes (Signed)
   Subjective:    Patient ID: Beth King, female    DOB: 11/02/1976, 41 y.o.   MRN: [id number]  HPI Pt returns for f/u of hyperthyroidism (dx'ed 2017; prob due to [place] dz, but she has never had thyroid imaging; she has IUD; she chose tapazole rx).  pt states she feels well in general, except for weight gain.   Past Medical History:  Diagnosis Date  . Allergic rhinitis   . Hyperthyroidism   . Migraine     No past surgical history on file.  Social History   Social History  . Marital status: Married    Spouse name: N/A  . Number of children: N/A  . Years of education: N/A   Occupational History  . Not on file.   Social History Main Topics  . Smoking status: Never Smoker  . Smokeless tobacco: Never Used  . Alcohol use Yes     Comment: Occassionally   . Drug use: No  . Sexual activity: Yes    Birth control/ protection: IUD   Other Topics Concern  . Not on file   Social History Narrative  . No narrative on file    Current Outpatient Prescriptions on File Prior to Visit  Medication Sig Dispense Refill  . ibuprofen (ADVIL,MOTRIN) 200 MG tablet Take 200 mg by mouth every 6 (six) hours as needed.    Marland Kitchen ketoprofen (ORUDIS) 50 MG capsule Take 50 mg by mouth 3 (three) times daily. For three days    . levocetirizine (XYZAL) 5 MG tablet Take 5 mg by mouth every evening. Reported on 01/11/2016    . levonorgestrel (MIRENA) 20 MCG/24HR IUD 1 each by Intrauterine route once.    . methimazole (TAPAZOLE) 10 MG tablet Take 1 tablet (10 mg total) by mouth daily. 30 tablet 3  . Multiple Vitamin (MULTIVITAMIN WITH MINERALS) TABS Take 1 tablet by mouth daily.    Marland Kitchen topiramate (TOPAMAX) 25 MG tablet Take 25 mg by mouth 2 (two) times daily.     No current facility-administered medications on file prior to visit.     No Known Allergies  Family History  Problem Relation Age of Onset  . Arthritis Father   . Diabetes Other   . Hypertension Other   . Thyroid disease Neg Hx    BP  122/82   Pulse 80   Ht 5\' 4"  (1.626 m)   Wt 193 lb (87.5 kg)   SpO2 97%   BMI 33.13 kg/m   Review of Systems Denies fever    Objective:   Physical Exam VITAL SIGNS:  See vs page.   GENERAL: no distress.   NECK: thyroid is slightly enlarged (R>L), but no palpable nodule.   Skin: not diaphoretic.   Neuro: no tremor.       Assessment & Plan:  Hyperthyroidism: due for recheck.    Patient Instructions  blood tests are requested for you today.  We'll let you know about the results.   if ever you have fever while taking methimazole, stop it and call us, even if the reason is obvious, because of the risk of a rare side-effect.   Please come back for a follow-up appointment in 4 months.

## 2017-06-24 MED FILL — methIMAzole 10 MG TABS: 10 | 30 days supply | Qty: 30 | Fill #2

## 2017-07-21 MED FILL — LEVOCETIRIZINE 5 MG TABLET: 5 | 90 days supply | Qty: 90 | Fill #0

## 2017-07-28 MED FILL — methIMAzole 10 MG TABS: 10 | 30 days supply | Qty: 30 | Fill #3

## 2017-10-02 ENCOUNTER — Other Ambulatory Visit: Payer: Self-pay | Admitting: Endocrinology

## 2017-10-02 MED FILL — methIMAzole 10 MG TABS: 10 | 30 days supply | Qty: 30 | Fill #0

## 2017-10-10 ENCOUNTER — Ambulatory Visit (INDEPENDENT_AMBULATORY_CARE_PROVIDER_SITE_OTHER): Payer: 59 | Admitting: Endocrinology

## 2017-10-10 ENCOUNTER — Encounter: Payer: Self-pay | Admitting: Endocrinology

## 2017-10-10 VITALS — BP 122/74 | HR 72 | Wt 201.0 lb

## 2017-10-10 DIAGNOSIS — R358 Other polyuria: Secondary | ICD-10-CM

## 2017-10-10 DIAGNOSIS — R3589 Other polyuria: Secondary | ICD-10-CM | POA: Insufficient documentation

## 2017-10-10 DIAGNOSIS — E0590 Thyrotoxicosis, unspecified without thyrotoxic crisis or storm: Secondary | ICD-10-CM

## 2017-10-10 LAB — HEMOGLOBIN A1C: Hgb A1c MFr Bld: 6.3 % (ref 4.6–6.5)

## 2017-10-10 LAB — TSH: TSH: 3.06 u[IU]/mL (ref 0.35–4.50)

## 2017-10-10 LAB — T4, FREE: Free T4: 0.81 ng/dL (ref 0.60–1.60)

## 2017-10-10 NOTE — Progress Notes (Signed)
   Subjective:    Patient ID: Beth King, female    DOB: 09/04/1976, 41 y.o.   MRN: [id number]  HPI Pt returns for f/u of hyperthyroidism (dx'ed 2017; prob due to [place] dz, but she has never had thyroid imaging; she has IUD; she chose tapazole rx).  pt states she feels well in general, except for frequent urination.   Past Medical History:  Diagnosis Date  . Allergic rhinitis   . Hyperthyroidism   . Migraine     No past surgical history on file.  Social History   Social History  . Marital status: Married    Spouse name: N/A  . Number of children: N/A  . Years of education: N/A   Occupational History  . Not on file.   Social History Main Topics  . Smoking status: Never Smoker  . Smokeless tobacco: Never Used  . Alcohol use Yes     Comment: Occassionally   . Drug use: No  . Sexual activity: Yes    Birth control/ protection: IUD   Other Topics Concern  . Not on file   Social History Narrative  . No narrative on file    Current Outpatient Prescriptions on File Prior to Visit  Medication Sig Dispense Refill  . ibuprofen (ADVIL,MOTRIN) 200 MG tablet Take 200 mg by mouth every 6 (six) hours as needed.    Marland Kitchen ketoprofen (ORUDIS) 50 MG capsule Take 50 mg by mouth 3 (three) times daily. For three days    . levocetirizine (XYZAL) 5 MG tablet Take 5 mg by mouth every evening. Reported on 01/11/2016    . levonorgestrel (MIRENA) 20 MCG/24HR IUD 1 each by Intrauterine route once.    . methimazole (TAPAZOLE) 10 MG tablet TAKE 1 TABLET (10 MG TOTAL) BY MOUTH DAILY. 30 tablet 3  . Multiple Vitamin (MULTIVITAMIN WITH MINERALS) TABS Take 1 tablet by mouth daily.    Marland Kitchen topiramate (TOPAMAX) 25 MG tablet Take 25 mg by mouth 2 (two) times daily.     No current facility-administered medications on file prior to visit.     No Known Allergies  Family History  Problem Relation Age of Onset  . Arthritis Father   . Diabetes Other   . Hypertension Other   . Thyroid disease Neg Hx      BP 122/74   Pulse 72   Wt 201 lb (91.2 kg)   SpO2 97%   BMI 34.50 kg/m     Review of Systems Denies fever.     Objective:   Physical Exam VITAL SIGNS:  See vs page.   GENERAL: no distress.   NECK: thyroid is slightly enlarged (R>L), but no palpable nodule.   Skin: not diaphoretic.   Neuro: no tremor.      Assessment & Plan:  Hyperthyroidism: due for recheck.  Patient Instructions  blood tests are requested for you today.  We'll let you know about the results.   if ever you have fever while taking methimazole, stop it and call us, even if the reason is obvious, because of the risk of a rare side-effect.   Please come back for a follow-up appointment in 6 months.

## 2017-10-10 NOTE — Patient Instructions (Addendum)
blood tests are requested for you today.  We'll let you know about the results.   if ever you have fever while taking methimazole, stop it and call us, even if the reason is obvious, because of the risk of a rare side-effect.   Please come back for a follow-up appointment in 6 months.    

## 2017-11-03 DIAGNOSIS — Z3040 Encounter for surveillance of contraceptives, unspecified: Secondary | ICD-10-CM | POA: Diagnosis not present

## 2017-11-03 DIAGNOSIS — E0590 Thyrotoxicosis, unspecified without thyrotoxic crisis or storm: Secondary | ICD-10-CM | POA: Diagnosis not present

## 2017-11-03 DIAGNOSIS — Z01411 Encounter for gynecological examination (general) (routine) with abnormal findings: Secondary | ICD-10-CM | POA: Diagnosis not present

## 2017-11-03 DIAGNOSIS — Z6834 Body mass index (BMI) 34.0-34.9, adult: Secondary | ICD-10-CM | POA: Diagnosis not present

## 2017-11-03 DIAGNOSIS — D259 Leiomyoma of uterus, unspecified: Secondary | ICD-10-CM | POA: Diagnosis not present

## 2017-11-03 DIAGNOSIS — Z1231 Encounter for screening mammogram for malignant neoplasm of breast: Secondary | ICD-10-CM | POA: Diagnosis not present

## 2017-12-24 DIAGNOSIS — R110 Nausea: Secondary | ICD-10-CM | POA: Diagnosis not present

## 2017-12-24 DIAGNOSIS — E0590 Thyrotoxicosis, unspecified without thyrotoxic crisis or storm: Secondary | ICD-10-CM | POA: Diagnosis not present

## 2017-12-24 DIAGNOSIS — G43009 Migraine without aura, not intractable, without status migrainosus: Secondary | ICD-10-CM | POA: Diagnosis not present

## 2017-12-24 DIAGNOSIS — M791 Myalgia, unspecified site: Secondary | ICD-10-CM | POA: Diagnosis not present

## 2017-12-24 DIAGNOSIS — R350 Frequency of micturition: Secondary | ICD-10-CM | POA: Diagnosis not present

## 2017-12-24 DIAGNOSIS — R1084 Generalized abdominal pain: Secondary | ICD-10-CM | POA: Diagnosis not present

## 2017-12-24 MED FILL — ONDANSETRON ODT 8 MG TABLET: 8 | 4 days supply | Qty: 10 | Fill #0

## 2018-01-06 DIAGNOSIS — M9906 Segmental and somatic dysfunction of lower extremity: Secondary | ICD-10-CM | POA: Diagnosis not present

## 2018-01-06 DIAGNOSIS — M9905 Segmental and somatic dysfunction of pelvic region: Secondary | ICD-10-CM | POA: Diagnosis not present

## 2018-01-06 DIAGNOSIS — M722 Plantar fascial fibromatosis: Secondary | ICD-10-CM | POA: Diagnosis not present

## 2018-01-06 DIAGNOSIS — M9903 Segmental and somatic dysfunction of lumbar region: Secondary | ICD-10-CM | POA: Diagnosis not present

## 2018-01-09 ENCOUNTER — Ambulatory Visit: Payer: 59 | Admitting: Neurology

## 2018-01-09 ENCOUNTER — Encounter: Payer: Self-pay | Admitting: Neurology

## 2018-01-09 VITALS — BP 112/75 | HR 72 | Ht 65.0 in | Wt 198.0 lb

## 2018-01-09 DIAGNOSIS — R42 Dizziness and giddiness: Secondary | ICD-10-CM

## 2018-01-09 DIAGNOSIS — R51 Headache with orthostatic component, not elsewhere classified: Secondary | ICD-10-CM

## 2018-01-09 DIAGNOSIS — G8929 Other chronic pain: Secondary | ICD-10-CM

## 2018-01-09 DIAGNOSIS — G43709 Chronic migraine without aura, not intractable, without status migrainosus: Secondary | ICD-10-CM | POA: Diagnosis not present

## 2018-01-09 DIAGNOSIS — H5461 Unqualified visual loss, right eye, normal vision left eye: Secondary | ICD-10-CM | POA: Diagnosis not present

## 2018-01-09 DIAGNOSIS — R519 Headache, unspecified: Secondary | ICD-10-CM

## 2018-01-09 MED ORDER — NARATRIPTAN HCL 2.5 MG PO TABS: ORAL_TABLET | ORAL | 11 refills | Status: DC

## 2018-01-09 MED ORDER — TOPIRAMATE ER 100 MG PO SPRINKLE CAP24: 100.0000 mg | EXTENDED_RELEASE_CAPSULE | Freq: Every day | ORAL | 11 refills | Status: DC

## 2018-01-09 MED ORDER — TOPIRAMATE ER 50 MG PO SPRINKLE CAP24: EXTENDED_RELEASE_CAPSULE | ORAL | 0 refills | Status: DC

## 2018-01-09 MED FILL — NARATRIPTAN HCL 2.5 MG TAB: 2.5 | 30 days supply | Qty: 8 | Fill #0

## 2018-01-09 NOTE — Patient Instructions (Addendum)
Mri brain w/wo contrast email me in 4-6 weeks and let me know how it is going and we can adjust the medications if needed Follow up in 3-4 months Start Topiramate ER titrate to 100mg  at night: Start with 50mg (one pill) at bedtime and in 1-2 weeks increase to 100mg (2x50mg  pills) at bedtime Amerge when she has a headache   Migraine Headache A migraine headache is an intense, throbbing pain on one side or both sides of the head. Migraines may also cause other symptoms, such as nausea, vomiting, and sensitivity to light and noise. What are the causes? Doing or taking certain things may also trigger migraines, such as:  Alcohol.  Smoking.  Medicines, such as: ? Medicine used to treat chest pain (nitroglycerine). ? Birth control pills. ? Estrogen pills. ? Certain blood pressure medicines.  Aged cheeses, chocolate, or caffeine.  Foods or drinks that contain nitrates, glutamate, aspartame, or tyramine.  Physical activity.  Other things that may trigger a migraine include:  Menstruation.  Pregnancy.  Hunger.  Stress, lack of sleep, too much sleep, or fatigue.  Weather changes.  What increases the risk? The following factors may make you more likely to experience migraine headaches:  Age. Risk increases with age.  Family history of migraine headaches.  Being Caucasian.  Depression and anxiety.  Obesity.  Being a woman.  Having a hole in the heart (patent foramen ovale) or other heart problems.  What are the signs or symptoms? The main symptom of this condition is pulsating or throbbing pain. Pain may:  Happen in any area of the head, such as on one side or both sides.  Interfere with daily activities.  Get worse with physical activity.  Get worse with exposure to bright lights or loud noises.  Other symptoms may include:  Nausea.  Vomiting.  Dizziness.  General sensitivity to bright lights, loud noises, or smells.  Before you get a migraine, you may  get warning signs that a migraine is developing (aura). An aura may include:  Seeing flashing lights or having blind spots.  Seeing bright spots, halos, or zigzag lines.  Having tunnel vision or blurred vision.  Having numbness or a tingling feeling.  Having trouble talking.  Having muscle weakness.  How is this diagnosed? A migraine headache can be diagnosed based on:  Your symptoms.  A physical exam.  Tests, such as CT scan or MRI of the head. These imaging tests can help rule out other causes of headaches.  Taking fluid from the spine (lumbar puncture) and analyzing it (cerebrospinal fluid analysis, or CSF analysis).  How is this treated? A migraine headache is usually treated with medicines that:  Relieve pain.  Relieve nausea.  Prevent migraines from coming back.  Treatment may also include:  Acupuncture.  Lifestyle changes like avoiding foods that trigger migraines.  Follow these instructions at home: Medicines  Take over-the-counter and prescription medicines only as told by your health care provider.  Do not drive or use heavy machinery while taking prescription pain medicine.  To prevent or treat constipation while you are taking prescription pain medicine, your health care provider may recommend that you: ? Drink enough fluid to keep your urine clear or pale yellow. ? Take over-the-counter or prescription medicines. ? Eat foods that are high in fiber, such as fresh fruits and vegetables, whole grains, and beans. ? Limit foods that are high in fat and processed sugars, such as fried and sweet foods. Lifestyle  Avoid alcohol use.  Do not  use any products that contain nicotine or tobacco, such as cigarettes and e-cigarettes. If you need help quitting, ask your health care provider.  Get at least 8 hours of sleep every night.  Limit your stress. General instructions   Keep a journal to find out what may trigger your migraine headaches. For  example, write down: ? What you eat and drink. ? How much sleep you get. ? Any change to your diet or medicines.  If you have a migraine: ? Avoid things that make your symptoms worse, such as bright lights. ? It may help to lie down in a dark, quiet room. ? Do not drive or use heavy machinery. ? Ask your health care provider what activities are safe for you while you are experiencing symptoms.  Keep all follow-up visits as told by your health care provider. This is important. Contact a health care provider if:  You develop symptoms that are different or more severe than your usual migraine symptoms. Get help right away if:  Your migraine becomes severe.  You have a fever.  You have a stiff neck.  You have vision loss.  Your muscles feel weak or like you cannot control them.  You start to lose your balance often.  You develop trouble walking.  You faint. This information is not intended to replace advice given to you by your health care provider. Make sure you discuss any questions you have with your health care provider. Document Released: 11/25/2005 Document Revised: 06/14/2016 Document Reviewed: 05/13/2016 Elsevier Interactive Patient Education  2017 [employer].  Topiramate extended-release capsules What is this medicine? TOPIRAMATE (toe PYRE a mate) is used to treat seizures in adults or children with epilepsy. It is also used for the prevention of migraine headaches. This medicine may be used for other purposes; ask your health care provider or pharmacist if you have questions. COMMON BRAND NAME(S): Trokendi XR What should I tell my health care provider before I take this medicine? They need to know if you have any of these conditions: -cirrhosis of the liver or liver disease -diarrhea -glaucoma -kidney stones or kidney disease -lung disease like asthma, obstructive pulmonary disease, emphysema -metabolic acidosis -on a ketogenic diet -scheduled for surgery or  a procedure -suicidal thoughts, plans, or attempt; a previous suicide attempt by you or a family member -an unusual or allergic reaction to topiramate, other medicines, foods, dyes, or preservatives -pregnant or trying to get pregnant -breast-feeding How should I use this medicine? Take this medicine by mouth with a glass of water. Follow the directions on the prescription label. Trokendi XR capsules must be swallowed whole. Do not sprinkle on food, break, crush, dissolve, or chew. Qudexy XR capsules may be swallowed whole or opened and sprinkled on a small amount of soft food. This mixture must be swallowed immediately. Do not chew or store mixture for later use. You may take this medicine with meals. Take your medicine at regular intervals. Do not take it more often than directed. Talk to your pediatrician regarding the use of this medicine in children. Special care may be needed. While Trokendi XR may be prescribed for children as young as 6 years and Qudexy XR may be prescribed for children as young as 2 years for selected conditions, precautions do apply. Overdosage: If you think you have taken too much of this medicine contact a poison control center or emergency room at once. NOTE: This medicine is only for you. Do not share this medicine with others. What  if I miss a dose? If you miss a dose, take it as soon as you can. If it is almost time for your next dose, take only that dose. Do not take double or extra doses. What may interact with this medicine? Do not take this medicine with any of the following medications: -probenecid This medicine may also interact with the following medications: -acetazolamide -alcohol -amitriptyline -birth control pills -digoxin -hydrochlorothiazide -lithium -medicines for pain, sleep, or muscle relaxation -metformin -methazolamide -other seizure or epilepsy medicines -pioglitazone -risperidone This list may not describe all possible interactions.  Give your health care provider a list of all the medicines, herbs, non-prescription drugs, or dietary supplements you use. Also tell them if you smoke, drink alcohol, or use illegal drugs. Some items may interact with your medicine. What should I watch for while using this medicine? Visit your doctor or health care professional for regular checks on your progress. Do not stop taking this medicine suddenly. This increases the risk of seizures if you are using this medicine to control epilepsy. Wear a medical identification bracelet or chain to say you have epilepsy or seizures, and carry a card that lists all your medicines. This medicine can decrease sweating and increase your body temperature. Watch for signs of deceased sweating or fever, especially in children. Avoid extreme heat, hot baths, and saunas. Be careful about exercising, especially in hot weather. Contact your health care provider right away if you notice a fever or decrease in sweating. You should drink plenty of fluids while taking this medicine. If you have had kidney stones in the past, this will help to reduce your chances of forming kidney stones. If you have stomach pain, with nausea or vomiting and yellowing of your eyes or skin, call your doctor immediately. You may get drowsy, dizzy, or have blurred vision. Do not drive, use machinery, or do anything that needs mental alertness until you know how this medicine affects you. To reduce dizziness, do not sit or stand up quickly, especially if you are an older patient. Alcohol can increase drowsiness and dizziness. Avoid alcoholic drinks. Do not drink alcohol for 6 hours before or 6 hours after taking Trokendi XR. If you notice blurred vision, eye pain, or other eye problems, seek medical attention at once for an eye exam. The use of this medicine may increase the chance of suicidal thoughts or actions. Pay special attention to how you are responding while on this medicine. Any worsening  of mood, or thoughts of suicide or dying should be reported to your health care professional right away. This medicine may increase the chance of developing metabolic acidosis. If left untreated, this can cause kidney stones, bone disease, or slowed growth in children. Symptoms include breathing fast, fatigue, loss of appetite, irregular heartbeat, or loss of consciousness. Call your doctor immediately if you experience any of these side effects. Also, tell your doctor about any surgery you plan on having while taking this medicine since this may increase your risk for metabolic acidosis. Birth control pills may not work properly while you are taking this medicine. Talk to your doctor about using an extra method of birth control. Women who become pregnant while using this medicine may enroll in the [place] Pregnancy Registry by calling [phone]. This registry collects information about the safety of antiepileptic drug use during pregnancy. What side effects may I notice from receiving this medicine? Side effects that you should report to your doctor or health care professional as  soon as possible: -allergic reactions like skin rash, itching or hives, swelling of the face, lips, or tongue -decreased sweating and/or rise in body temperature -depression -difficulty breathing, fast or irregular breathing patterns -difficulty speaking -difficulty walking or controlling muscle movements -hearing impairment -redness, blistering, peeling or loosening of the skin, including inside the mouth -tingling, pain or numbness in the hands or feet -unusually weak or tired -worsening of mood, thoughts or actions of suicide or dying Side effects that usually do not require medical attention (report to your doctor or health care professional if they continue or are bothersome): -altered taste -back pain, joint or muscle aches and pains -diarrhea, or constipation -headache -loss of  appetite -nausea -stomach upset, indigestion -tremors This list may not describe all possible side effects. Call your doctor for medical advice about side effects. You may report side effects to FDA at 1-800-FDA-1088. Where should I keep my medicine? Keep out of the reach of children. Store at room temperature between 15 and 30 degrees C (59 and 86 degrees F) in a tightly closed container. Protect from moisture. Throw away any unused medicine after the expiration date. NOTE: This sheet is a summary. It may not cover all possible information. If you have questions about this medicine, talk to your doctor, pharmacist, or health care provider.  2018 Elsevier/Gold Standard (2016-03-15 12:33:11)

## 2018-01-09 NOTE — Progress Notes (Signed)
[facility]    Provider:  Dr Jaynee Eagles Referring Provider: Donald Prose, MD Primary Care Physician:  Donald Prose, MD  CC:  migraines  HPI:  Beth King is a 42 y.o. female here as a referral from Dr. Nancy Fetter for migraines.  Past medical history migraine, fibroid, hypothyroidism, prediabetes.  She is a previous patient of Dr. Riley Nearing. No FHx. Migraines started 3 years ago. She has severe pain on the left side of the head,nausea, vomiting, kight sensitivity and noise and movement worsened headache, being in a dark room helped. They have been trying different medication. She takes Amerge for acute management. For the last 6 months worsening, Worse with the weather. They are severe and can last 24 hours without treatment. She has daily headaches, >15 a month are migrainous. She takes ibuprofen no more than 10 days a month. She has 7 severe migraines a month. Her headaches are positional and wake her up, they can be in the posterior head.No significant caffeine. She doesn't know if she snores. She wakes up with headaches. She feels these are not her regular migraines. She has vision changes, she couldn;t read with the right vision loss in the right eye.   Reviewed notes, labs and imaging from outside physicians, which showed:  CBC unremarkable, labs were taken December 26, 2017, CMP with BUN 9 and creatinine 0.79 and otherwise unremarkable, TSH normal, LDL 119, sed rate slightly elevated at 30, ANA negative  She has a headache for the past week, headache is left-sided, severe enough that she could not lift her head and had blurred vision, have been taking daily digestive enzyme for 2 weeks, she was on a: Please, nauseated for 4 days, patient traveled to [place] 6 months ago, has been fine in between, no fever, has urinary frequency for a long time, no blurred vision no excessive thirst, extended family has diabetes, Mirena IUD was replaced last year, patient has a long history of headaches,  migraines, tension, sinus headache.  Headache was 10 out of 10 yesterday.  Did not take anything for it.  She has been on Topamax but she is not taking regularly.  Review of Systems: Patient complains of symptoms per HPI as well as the following symptoms: headache, blurred vision, weight gain, dizziness. Pertinent negatives and positives per HPI. All others negative.   Social History   Socioeconomic History  . Marital status: Married    Spouse name: Not on file  . Number of children: 0  . Years of education: Not on file  . Highest education level: Associate degree: occupational, Hotel manager, or vocational program  Social Needs  . Financial resource strain: Not on file  . Food insecurity - worry: Not on file  . Food insecurity - inability: Not on file  . Transportation needs - medical: Not on file  . Transportation needs - non-medical: Not on file  Occupational History  . Not on file  Tobacco Use  . Smoking status: Never Smoker  . Smokeless tobacco: Never Used  Substance and Sexual Activity  . Alcohol use: Yes    Comment: Occassionally   . Drug use: No  . Sexual activity: Yes    Birth control/protection: IUD  Other Topics Concern  . Not on file  Social History Narrative   [employer]   Lives at home with husband and mother     Right handed   Drinks occasional caffeine    Family History  Problem Relation Age of Onset  . Arthritis Father   .  Diabetes Other   . Hypertension Other   . Thyroid disease Neg Hx     Past Medical History:  Diagnosis Date  . Allergic rhinitis   . Enlarged uterus   . Fibroid   . Hyperthyroidism   . Migraine   . Plantar fasciitis   . Pre-diabetes   . Seasonal allergies     History reviewed. No pertinent surgical history.  Current Outpatient Medications  Medication Sig Dispense Refill  . levocetirizine (XYZAL) 5 MG tablet Take 5 mg by mouth every evening. Reported on 01/11/2016    . levonorgestrel (MIRENA) 20 MCG/24HR IUD 1 each by  Intrauterine route once.    . methimazole (TAPAZOLE) 10 MG tablet TAKE 1 TABLET (10 MG TOTAL) BY MOUTH DAILY. 30 tablet 3  . Multiple Vitamin (MULTIVITAMIN WITH MINERALS) TABS Take 1 tablet by mouth daily.    . naratriptan (AMERGE) 2.5 MG tablet Take one (1) tab at onset of headache, may taken an additional dose in 2 hours. Do not take more than 2 pills (5 mg)  in 24 hours. 10 tablet 11  . ibuprofen (ADVIL,MOTRIN) 200 MG tablet Take 200 mg by mouth every 6 (six) hours as needed.    . Topiramate ER (QUDEXY XR) 100 MG CS24 sprinkle capsule Take 100 mg by mouth at bedtime. 30 each 11  . Topiramate ER (QUDEXY XR) 50 MG CS24 sprinkle capsule Take 1 capsule (50 mg) by mouth every night for 7 days then take 2 capsules (100 mg total) every night. 90 each 0   No current facility-administered medications for this visit.     Allergies as of 01/09/2018 - Review Complete 01/09/2018  Allergen Reaction Noted  . Chloroquine phosphate [chloroquine] Itching 01/09/2018    Vitals: BP 112/75 (BP Location: Left Arm, Patient Position: Sitting)   Pulse 72   Ht 5\' 5"  (1.651 m)   Wt 198 lb (89.8 kg)   BMI 32.95 kg/m  Last Weight:  Wt Readings from Last 1 Encounters:  01/09/18 198 lb (89.8 kg)   Last Height:   Ht Readings from Last 1 Encounters:  01/09/18 5\' 5"  (1.651 m)   Physical exam: Exam: Gen: NAD, conversant, well nourised, obese, well groomed                     CV: RRR, no MRG. No Carotid Bruits. No peripheral edema, warm, nontender Eyes: Conjunctivae clear without exudates or hemorrhage  Neuro: Detailed Neurologic Exam  Speech:    Speech is normal; fluent and spontaneous with normal comprehension.  Cognition:    The patient is oriented to person, place, and time;     recent and remote memory intact;     language fluent;     normal attention, concentration,     fund of knowledge Cranial Nerves:    The pupils are equal, round, and reactive to light. The fundi are normal and spontaneous  venous pulsations are present. Visual fields are full to finger confrontation. Extraocular movements are intact. Trigeminal sensation is intact and the muscles of mastication are normal. The face is symmetric. The palate elevates in the midline. Hearing intact. Voice is normal. Shoulder shrug is normal. The tongue has normal motion without fasciculations.   Coordination:    Normal finger to nose and heel to shin. Normal rapid alternating movements.   Gait:    Heel-toe and tandem gait are normal.   Motor Observation:    No asymmetry, no atrophy, and no involuntary movements noted. Tone:  Normal muscle tone.    Posture:    Posture is normal. normal erect    Strength:    Strength is V/V in the upper and lower limbs.      Sensation: intact to LT     Reflex Exam:  DTR's:    Deep tendon reflexes in the upper and lower extremities are normal bilaterally.   Toes:    The toes are downgoing bilaterally.   Clonus:    Clonus is absent.       Assessment/Plan:  42 year old patient with PMHx migraines.   MRI brain w/wo contrast due to concerning symptom of positional occipital headache to evaluate for space-occupying masses, lesions, tumors, chiari or any other intracranial etiology.   email me in 4-6 weeks and let me know how it is going and we can adjust the medications  Start Qudexy for prevention Amerge for acute  Orders Placed This Encounter  Procedures  . MR BRAIN W WO CONTRAST   Cc: Dr. Nancy Fetter  Discussed: To prevent or relieve headaches, try the following: Cool Compress. Lie down and place a cool compress on your head.  Avoid headache triggers. If certain foods or odors seem to have triggered your migraines in the past, avoid them. A headache diary might help you identify triggers.  Include physical activity in your daily routine. Try a daily walk or other moderate aerobic exercise.  Manage stress. Find healthy ways to cope with the stressors, such as delegating tasks on  your to-do list.  Practice relaxation techniques. Try deep breathing, yoga, massage and visualization.  Eat regularly. Eating regularly scheduled meals and maintaining a healthy diet might help prevent headaches. Also, drink plenty of fluids.  Follow a regular sleep schedule. Sleep deprivation might contribute to headaches Consider biofeedback. With this mind-body technique, you learn to control certain bodily functions - such as muscle tension, heart rate and blood pressure - to prevent headaches or reduce headache pain.    Proceed to emergency room if you experience new or worsening symptoms or symptoms do not resolve, if you have new neurologic symptoms or if headache is severe, or for any concerning symptom.   Provided education and documentation from American headache Society toolbox including articles on: chronic migraine medication overuse headache, chronic migraines, prevention of migraines, behavioral and other nonpharmacologic treatments for headache.   Sarina Ill, MD  [facility] [address] [place], [place] [address]  Phone [phone] Fax [phone]

## 2018-01-19 ENCOUNTER — Ambulatory Visit (INDEPENDENT_AMBULATORY_CARE_PROVIDER_SITE_OTHER): Payer: Self-pay | Admitting: Emergency Medicine

## 2018-01-19 ENCOUNTER — Other Ambulatory Visit: Payer: Self-pay

## 2018-01-19 ENCOUNTER — Encounter (HOSPITAL_COMMUNITY): Payer: Self-pay | Admitting: Emergency Medicine

## 2018-01-19 ENCOUNTER — Ambulatory Visit (HOSPITAL_COMMUNITY)
Admission: EM | Admit: 2018-01-19 | Discharge: 2018-01-19 | Disposition: A | Discharge status: Home / Self Care | Payer: 59 | Attending: Family Medicine | Admitting: Family Medicine

## 2018-01-19 VITALS — BP 122/90 | HR 77 | Temp 98.4°F | Resp 17

## 2018-01-19 DIAGNOSIS — R079 Chest pain, unspecified: Secondary | ICD-10-CM

## 2018-01-19 DIAGNOSIS — R0789 Other chest pain: Secondary | ICD-10-CM | POA: Diagnosis not present

## 2018-01-19 DIAGNOSIS — R5383 Other fatigue: Secondary | ICD-10-CM

## 2018-01-19 DIAGNOSIS — H8110 Benign paroxysmal vertigo, unspecified ear: Secondary | ICD-10-CM | POA: Diagnosis not present

## 2018-01-19 DIAGNOSIS — G44209 Tension-type headache, unspecified, not intractable: Secondary | ICD-10-CM

## 2018-01-19 MED ORDER — KETOROLAC TROMETHAMINE 60 MG/2ML IM SOLN
INTRAMUSCULAR | Status: AC
  Filled 2018-01-19: qty 2

## 2018-01-19 MED ORDER — MECLIZINE HCL 25 MG PO TABS: 25.0000 mg | ORAL_TABLET | Freq: Three times a day (TID) | ORAL | 0 refills | Status: DC | PRN

## 2018-01-19 MED ORDER — KETOROLAC TROMETHAMINE 60 MG/2ML IM SOLN
60.0000 mg | Freq: Once | INTRAMUSCULAR | Status: AC
  Administered 2018-01-19: 60 mg via INTRAMUSCULAR

## 2018-01-19 NOTE — Patient Instructions (Signed)
I recommend follow up at the ED to rule out cardiac cause of symptoms.

## 2018-01-19 NOTE — Progress Notes (Signed)
S: Beth King is a 42 y.o. female who presents for aches, myalgias, and fatigue. She describes her pain as midsternal, radiating to the back. States her pain is worsened with movement and activity and relieved by rest. She has not had any fever or chills. Reports sleeping upright last night due to symptoms. She does not smoke, reports history of "prediabetes", family history significant for hypertension, denies family history of heart attack. States she has a history of enlarged heart, stating it was diagnosed on chest x-ray 12 years ago for TB rule out, has not been evaluated by cardiology for this condition. Otherwise reports to be in good health.  Review of Systems  Constitutional: Positive for malaise/fatigue. Negative for chills and fever.  HENT: Negative for congestion, hearing loss and sinus pain.   Eyes: Positive for blurred vision. Negative for photophobia and pain.  Respiratory: Negative for cough, shortness of breath and wheezing.   Cardiovascular: Positive for chest pain. Negative for palpitations and leg swelling.  Gastrointestinal: Positive for abdominal pain. Negative for diarrhea, nausea and vomiting.  Musculoskeletal: Positive for back pain and myalgias.  Neurological: Negative.   Endo/Heme/Allergies: Negative for polydipsia.    O:  Vitals:   01/19/18 1252  BP: 122/90  Pulse: 77  Resp: 17  Temp: 98.4 F (36.9 C)  SpO2: 98%   Physical Exam  Constitutional: She is oriented to person, place, and time. She appears well-developed and well-nourished. No distress.  HENT:  Head: Normocephalic.  Right Ear: External ear normal.  Left Ear: External ear normal.  Eyes: Conjunctivae are normal.  Neck: Normal range of motion. No JVD present.  Cardiovascular: Normal rate, regular rhythm and intact distal pulses.  No murmur heard. Pulmonary/Chest: Effort normal and breath sounds normal. She has no wheezes.  Abdominal: Soft.  Musculoskeletal: She exhibits no edema.   Lymphadenopathy:    She has no cervical adenopathy.  Neurological: She is alert and oriented to person, place, and time.  Skin: Skin is warm and dry. Capillary refill takes less than 2 seconds. She is not diaphoretic.  Psychiatric: She has a normal mood and affect.  Nursing note and vitals reviewed.  A:  1. Chest pain, unspecified type   2. Fatigue, unspecified type    P:   Differential for symptoms is extensive ranging from GERD, URI, FLU, MI, cardiac disease, diabetes, etc. As we do not have EKG or labs to rule out serious pathology, recommend following up at the ER to rule out cardiac cause.

## 2018-01-19 NOTE — Discharge Instructions (Addendum)
Please follow up with your primary doctor or here tomorrow morning if not seeing improvement of your headache.  Medications given today: IM Toradol 60mg  for your headache.  Please return here or to the Emergency Department immediately should you feel worse in any way or have any of the following symptoms: increasing or different chest pain, pain that spreads to your arm, neck, jaw, back or abdomen, shortness of breath, or nausea and vomiting.

## 2018-01-19 NOTE — ED Triage Notes (Signed)
PT reports lightheadedness yesterday, Pain all over, headache for 2 days, blurred vision that she woke up with this AM. No fevers. History of migraines. Denies nausea and vomiting.   Adds intermittent chest pain for months.

## 2018-01-20 NOTE — ED Provider Notes (Addendum)
[facility]   [id number] 01/19/18 Arrival Time: 4098  ASSESSMENT & PLAN:  1. Acute non intractable tension-type headache   2. Benign paroxysmal positional vertigo, unspecified laterality   3. Chest pain, unspecified type     Meds ordered this encounter  Medications  . ketorolac (TORADOL) injection 60 mg  . meclizine (ANTIVERT) 25 MG tablet    Sig: Take 1 tablet (25 mg total) by mouth 3 (three) times daily as needed for dizziness.    Dispense:  20 tablet    Refill:  0   I feel her chest symptoms related to neck and upper back tension that is likely cause of her HA. Normal exam. ECG without acute changes. Discussed with her. Reassured. Will f/u here tomorrow morning if not improving. Discussed typical duration of vertigo symptoms. Trial of meclizine if she desires. If vertigo is not improving over the next few days she will plan to see ENT.  Reviewed expectations re: course of current medical issues. Questions answered. Outlined signs and symptoms indicating need for more acute intervention. Patient verbalized understanding. After Visit Summary given.   SUBJECTIVE:  Beth King is a 42 y.o. female who presents with complaint of a headache for the past 2 days. Gets headaches regularly but this one is lasting a little longer than usual. Reports tenderness of neck and upper back as well as chest discomfort. Describes headache as throbbing/soreness over temporal and occipital head. No associated n/v. Questions very transient blurry vision this am but usually has this with her headaches. No recent illnesses. Afebrile. No OTC treatment. Taking her usual medications as directed. Ambulatory without difficulty. No extremity sensation changes or weakness. No specific aggravating or alleviating factors reported. No SOB.  Also mentions slight vertigo over the past 24 hours. Very transient. Worse with certain movements. Better when she is still. No h/o similar. No  congestion.  ROS: As per HPI. All other systems negative.   OBJECTIVE:  Vitals:   01/19/18 1445  BP: 132/84  Pulse: 84  Resp: 16  Temp: 97.8 F (36.6 C)  TempSrc: Oral  SpO2: 100%  Weight: 198 lb (89.8 kg)    General appearance: alert; no distress Eyes: PERRLA; EOMI; conjunctiva normal HENT: normocephalic; atraumatic Neck: supple with FROM Lungs: clear to auscultation bilaterally Heart: regular rate and rhythm Extremities: no edema; symmetrical with no gross deformities Skin: warm and dry Neurologic: CN 2-12 grossly intact; normal gait; normal symmetric reflexes; normal extremity strength and sensation throughout Psychological: alert and cooperative; normal mood and affect   Allergies  Allergen Reactions  . Chloroquine Phosphate [Chloroquine] Itching    "itching all over my body for almost like a week"    Past Medical History:  Diagnosis Date  . Allergic rhinitis   . Enlarged uterus   . Fibroid   . Hyperthyroidism   . Migraine   . Plantar fasciitis   . Pre-diabetes   . Seasonal allergies    Social History   Socioeconomic History  . Marital status: Married    Spouse name: Not on file  . Number of children: 0  . Years of education: Not on file  . Highest education level: Associate degree: occupational, Hotel manager, or vocational program  Social Needs  . Financial resource strain: Not on file  . Food insecurity - worry: Not on file  . Food insecurity - inability: Not on file  . Transportation needs - medical: Not on file  . Transportation needs - non-medical: Not on file  Occupational History  .  Not on file  Tobacco Use  . Smoking status: Never Smoker  . Smokeless tobacco: Never Used  Substance and Sexual Activity  . Alcohol use: Yes    Comment: Occassionally   . Drug use: No  . Sexual activity: Yes    Birth control/protection: IUD  Other Topics Concern  . Not on file  Social History Narrative   [employer]   Lives at home with husband and  mother     Right handed   Drinks occasional caffeine   Family History  Problem Relation Age of Onset  . Arthritis Father   . Diabetes Other   . Hypertension Other   . Thyroid disease Neg Hx    History reviewed. No pertinent surgical history.   Vanessa Kick, MD 01/20/18 1013    Vanessa Kick, MD 01/20/18 1013

## 2018-01-23 ENCOUNTER — Encounter: Payer: Self-pay | Admitting: Neurology

## 2018-01-27 ENCOUNTER — Telehealth: Payer: Self-pay | Admitting: *Deleted

## 2018-01-27 NOTE — Telephone Encounter (Signed)
Received [employer] FMLA forms. Sent to medical records for payment collect prior to completion.

## 2018-01-28 DIAGNOSIS — Z0289 Encounter for other administrative examinations: Secondary | ICD-10-CM

## 2018-01-28 NOTE — Telephone Encounter (Signed)
R/C pmt [employer] form on [employer].

## 2018-01-30 ENCOUNTER — Emergency Department (HOSPITAL_COMMUNITY): Payer: 59

## 2018-01-30 ENCOUNTER — Other Ambulatory Visit: Payer: Self-pay

## 2018-01-30 ENCOUNTER — Emergency Department (HOSPITAL_COMMUNITY)
Admission: EM | Admit: 2018-01-30 | Discharge: 2018-01-30 | Disposition: A | Discharge status: Home / Self Care | Payer: 59 | Attending: Emergency Medicine | Admitting: Emergency Medicine

## 2018-01-30 DIAGNOSIS — Y998 Other external cause status: Secondary | ICD-10-CM | POA: Insufficient documentation

## 2018-01-30 DIAGNOSIS — Y9389 Activity, other specified: Secondary | ICD-10-CM | POA: Insufficient documentation

## 2018-01-30 DIAGNOSIS — M7918 Myalgia, other site: Secondary | ICD-10-CM

## 2018-01-30 DIAGNOSIS — S161XXA Strain of muscle, fascia and tendon at neck level, initial encounter: Secondary | ICD-10-CM | POA: Diagnosis not present

## 2018-01-30 DIAGNOSIS — R079 Chest pain, unspecified: Secondary | ICD-10-CM | POA: Diagnosis not present

## 2018-01-30 DIAGNOSIS — I10 Essential (primary) hypertension: Secondary | ICD-10-CM | POA: Insufficient documentation

## 2018-01-30 DIAGNOSIS — M542 Cervicalgia: Secondary | ICD-10-CM | POA: Diagnosis not present

## 2018-01-30 DIAGNOSIS — S199XXA Unspecified injury of neck, initial encounter: Secondary | ICD-10-CM | POA: Diagnosis present

## 2018-01-30 DIAGNOSIS — Z79899 Other long term (current) drug therapy: Secondary | ICD-10-CM | POA: Insufficient documentation

## 2018-01-30 DIAGNOSIS — Y92410 Unspecified street and highway as the place of occurrence of the external cause: Secondary | ICD-10-CM | POA: Diagnosis not present

## 2018-01-30 DIAGNOSIS — T148XXA Other injury of unspecified body region, initial encounter: Secondary | ICD-10-CM | POA: Diagnosis not present

## 2018-01-30 DIAGNOSIS — M5489 Other dorsalgia: Secondary | ICD-10-CM | POA: Diagnosis not present

## 2018-01-30 DIAGNOSIS — M791 Myalgia, unspecified site: Secondary | ICD-10-CM | POA: Insufficient documentation

## 2018-01-30 DIAGNOSIS — M7912 Myalgia of auxiliary muscles, head and neck: Secondary | ICD-10-CM | POA: Diagnosis not present

## 2018-01-30 MED ORDER — HYDROCODONE-ACETAMINOPHEN 5-325 MG PO TABS
1.0000 | ORAL_TABLET | Freq: Once | ORAL | Status: AC
  Administered 2018-01-30: 1 via ORAL
  Filled 2018-01-30: qty 1

## 2018-01-30 MED ORDER — IBUPROFEN 400 MG PO TABS
600.0000 mg | ORAL_TABLET | Freq: Once | ORAL | Status: AC
  Administered 2018-01-30: 600 mg via ORAL
  Filled 2018-01-30: qty 1

## 2018-01-30 MED ORDER — NAPROXEN 500 MG PO TABS: 500.0000 mg | ORAL_TABLET | Freq: Two times a day (BID) | ORAL | 0 refills | Status: DC

## 2018-01-30 MED ORDER — METHOCARBAMOL 500 MG PO TABS: 500.0000 mg | ORAL_TABLET | Freq: Two times a day (BID) | ORAL | 0 refills | Status: DC

## 2018-01-30 MED FILL — NAPROXEN 500 MG TABLET: 500 | 15 days supply | Qty: 30 | Fill #0

## 2018-01-30 MED FILL — METHOCARBAMOL 500 MG TABS: 500 | 10 days supply | Qty: 20 | Fill #0

## 2018-01-30 NOTE — ED Notes (Signed)
Pt. Unable to sign due to computer malfunction. Discharge paperwork discussed and pt. Had no additional questions.
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
## 2018-01-30 NOTE — ED Notes (Signed)
Pt. To XRAY via stretcher. 
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
## 2018-01-30 NOTE — Discharge Instructions (Signed)

## 2018-02-02 NOTE — Telephone Encounter (Signed)
[employer] forms completed, ready for MD review and signature.

## 2018-02-03 NOTE — Telephone Encounter (Signed)
[employer] forms completed, signed & sent to medical records for processing.

## 2018-02-04 DIAGNOSIS — S161XXA Strain of muscle, fascia and tendon at neck level, initial encounter: Secondary | ICD-10-CM | POA: Diagnosis not present

## 2018-02-05 ENCOUNTER — Emergency Department (HOSPITAL_COMMUNITY): Payer: 59

## 2018-02-05 ENCOUNTER — Encounter (HOSPITAL_COMMUNITY): Payer: Self-pay

## 2018-02-05 ENCOUNTER — Emergency Department (HOSPITAL_COMMUNITY)
Admission: EM | Admit: 2018-02-05 | Discharge: 2018-02-05 | Disposition: A | Discharge status: Home / Self Care | Payer: 59 | Attending: Emergency Medicine | Admitting: Emergency Medicine

## 2018-02-05 DIAGNOSIS — M25512 Pain in left shoulder: Secondary | ICD-10-CM | POA: Diagnosis not present

## 2018-02-05 DIAGNOSIS — Y929 Unspecified place or not applicable: Secondary | ICD-10-CM | POA: Diagnosis not present

## 2018-02-05 DIAGNOSIS — S199XXA Unspecified injury of neck, initial encounter: Secondary | ICD-10-CM | POA: Diagnosis present

## 2018-02-05 DIAGNOSIS — E039 Hypothyroidism, unspecified: Secondary | ICD-10-CM | POA: Diagnosis not present

## 2018-02-05 DIAGNOSIS — Y999 Unspecified external cause status: Secondary | ICD-10-CM | POA: Diagnosis not present

## 2018-02-05 DIAGNOSIS — Z79899 Other long term (current) drug therapy: Secondary | ICD-10-CM | POA: Diagnosis not present

## 2018-02-05 DIAGNOSIS — S161XXA Strain of muscle, fascia and tendon at neck level, initial encounter: Secondary | ICD-10-CM | POA: Diagnosis not present

## 2018-02-05 DIAGNOSIS — Y939 Activity, unspecified: Secondary | ICD-10-CM | POA: Insufficient documentation

## 2018-02-05 DIAGNOSIS — S4992XA Unspecified injury of left shoulder and upper arm, initial encounter: Secondary | ICD-10-CM | POA: Diagnosis not present

## 2018-02-05 MED ORDER — CYCLOBENZAPRINE HCL 10 MG PO TABS: 10.0000 mg | ORAL_TABLET | Freq: Two times a day (BID) | ORAL | 0 refills | Status: DC | PRN

## 2018-02-05 MED ORDER — NAPROXEN 500 MG PO TABS: 500.0000 mg | ORAL_TABLET | Freq: Two times a day (BID) | ORAL | 0 refills | Status: DC
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
## 2018-03-12 MED FILL — methIMAzole 10 MG TABS: 10 | 30 days supply | Qty: 30 | Fill #1

## 2018-03-31 DIAGNOSIS — Z0000 Encounter for general adult medical examination without abnormal findings: Secondary | ICD-10-CM | POA: Diagnosis not present

## 2018-03-31 DIAGNOSIS — R7303 Prediabetes: Secondary | ICD-10-CM | POA: Diagnosis not present

## 2018-03-31 DIAGNOSIS — E0590 Thyrotoxicosis, unspecified without thyrotoxic crisis or storm: Secondary | ICD-10-CM | POA: Diagnosis not present

## 2018-03-31 DIAGNOSIS — J302 Other seasonal allergic rhinitis: Secondary | ICD-10-CM | POA: Diagnosis not present

## 2018-04-14 ENCOUNTER — Encounter: Payer: Self-pay | Admitting: Endocrinology

## 2018-04-14 ENCOUNTER — Ambulatory Visit: Payer: 59 | Admitting: Endocrinology

## 2018-04-14 VITALS — BP 112/68 | HR 84 | Wt 199.4 lb

## 2018-04-14 DIAGNOSIS — E0590 Thyrotoxicosis, unspecified without thyrotoxic crisis or storm: Secondary | ICD-10-CM

## 2018-04-14 LAB — T4, FREE: Free T4: 0.83 ng/dL (ref 0.60–1.60)

## 2018-04-14 LAB — TSH: TSH: 0.56 u[IU]/mL (ref 0.35–4.50)

## 2018-04-14 NOTE — Progress Notes (Signed)
Subjective:    Patient ID: Beth King, female    DOB: Sep 20, 1976, 42 y.o.   MRN: [id number]  HPI Pt returns for f/u of hyperthyroidism (dx'ed 2017; prob due to [place] dz, but she has never had thyroid imaging; she has IUD; she chose tapazole rx).  pt states she feels well in general.  She has not considering a pregnancy.    Past Medical History:  Diagnosis Date  . Allergic rhinitis   . Enlarged uterus   . Fibroid   . Hyperthyroidism   . Migraine   . Plantar fasciitis   . Pre-diabetes   . Seasonal allergies     No past surgical history on file.  Social History   Socioeconomic History  . Marital status: Married    Spouse name: Not on file  . Number of children: 0  . Years of education: Not on file  . Highest education level: Associate degree: occupational, Hotel manager, or vocational program  Occupational History  . Not on file  Social Needs  . Financial resource strain: Not on file  . Food insecurity:    Worry: Not on file    Inability: Not on file  . Transportation needs:    Medical: Not on file    Non-medical: Not on file  Tobacco Use  . Smoking status: Never Smoker  . Smokeless tobacco: Never Used  Substance and Sexual Activity  . Alcohol use: Yes    Comment: Occassionally   . Drug use: No  . Sexual activity: Yes    Birth control/protection: IUD  Lifestyle  . Physical activity:    Days per week: Not on file    Minutes per session: Not on file  . Stress: Not on file  Relationships  . Social connections:    Talks on phone: Not on file    Gets together: Not on file    Attends religious service: Not on file    Active member of club or organization: Not on file    Attends meetings of clubs or organizations: Not on file    Relationship status: Not on file  . Intimate partner violence:    Fear of current or ex partner: Not on file    Emotionally abused: Not on file    Physically abused: Not on file    Forced sexual activity: Not on file  Other Topics  Concern  . Not on file  Social History Narrative   Jehovah's Witness   Lives at home with husband and mother     Right handed   Drinks occasional caffeine    Current Outpatient Medications on File Prior to Visit  Medication Sig Dispense Refill  . cyclobenzaprine (FLEXERIL) 10 MG tablet Take 1 tablet (10 mg total) by mouth 2 (two) times daily as needed for muscle spasms. 20 tablet 0  . ibuprofen (ADVIL,MOTRIN) 200 MG tablet Take 200 mg by mouth every 6 (six) hours as needed.    Marland Kitchen levocetirizine (XYZAL) 5 MG tablet Take 5 mg by mouth every evening. Reported on 01/11/2016    . levonorgestrel (MIRENA) 20 MCG/24HR IUD 1 each by Intrauterine route once.    . meclizine (ANTIVERT) 25 MG tablet Take 1 tablet (25 mg total) by mouth 3 (three) times daily as needed for dizziness. 20 tablet 0  . methimazole (TAPAZOLE) 10 MG tablet TAKE 1 TABLET (10 MG TOTAL) BY MOUTH DAILY. 30 tablet 3  . methocarbamol (ROBAXIN) 500 MG tablet Take 1 tablet (500 mg total) by mouth 2 (  two) times daily. 20 tablet 0  . Multiple Vitamin (MULTIVITAMIN WITH MINERALS) TABS Take 1 tablet by mouth daily.    . naproxen (NAPROSYN) 500 MG tablet Take 1 tablet (500 mg total) by mouth 2 (two) times daily. 30 tablet 0  . naratriptan (AMERGE) 2.5 MG tablet Take one (1) tab at onset of headache, may taken an additional dose in 2 hours. Do not take more than 2 pills (5 mg)  in 24 hours. 10 tablet 11  . Topiramate ER (QUDEXY XR) 100 MG CS24 sprinkle capsule Take 100 mg by mouth at bedtime. 30 each 11  . Topiramate ER (QUDEXY XR) 50 MG CS24 sprinkle capsule Take 1 capsule (50 mg) by mouth every night for 7 days then take 2 capsules (100 mg total) every night. 90 each 0   No current facility-administered medications on file prior to visit.     Allergies  Allergen Reactions  . Chloroquine Phosphate [Chloroquine] Itching    "itching all over my body for almost like a week"    Family History  Problem Relation Age of Onset  . Arthritis  Father   . Diabetes Other   . Hypertension Other   . Thyroid disease Neg Hx     BP 112/68   Pulse 84   Wt 199 lb 6.4 oz (90.4 kg)   SpO2 97%   BMI 34.23 kg/m    Review of Systems Denies fever.     Objective:   Physical Exam VITAL SIGNS:  See vs page.   GENERAL: no distress.   NECK: thyroid is 3-5 times normal size (R>L), but no palpable nodule.   Skin: not diaphoretic.   Neuro: no tremor.       Assessment & Plan:  Hyperthyroidism: due for recheck.   Contraception: Please continue the same rx.  Patient Instructions  blood tests are requested for you today.  We'll let you know about the results.   if ever you have fever while taking methimazole, stop it and call us, even if the reason is obvious, because of the risk of a rare side-effect.   Please come back for a follow-up appointment in 6 months.

## 2018-04-14 NOTE — Patient Instructions (Signed)
blood tests are requested for you today.  We'll let you know about the results.   if ever you have fever while taking methimazole, stop it and call us, even if the reason is obvious, because of the risk of a rare side-effect.   Please come back for a follow-up appointment in 6 months.    

## 2018-04-21 MED FILL — LEVOCETIRIZINE 5 MG TABLET: 5 | 90 days supply | Qty: 90 | Fill #0

## 2018-05-19 ENCOUNTER — Ambulatory Visit: Payer: 59 | Admitting: Adult Health

## 2018-09-30 DIAGNOSIS — E6609 Other obesity due to excess calories: Secondary | ICD-10-CM | POA: Diagnosis not present

## 2018-09-30 DIAGNOSIS — K219 Gastro-esophageal reflux disease without esophagitis: Secondary | ICD-10-CM | POA: Diagnosis not present

## 2018-09-30 DIAGNOSIS — R1012 Left upper quadrant pain: Secondary | ICD-10-CM | POA: Diagnosis not present

## 2018-09-30 DIAGNOSIS — R7303 Prediabetes: Secondary | ICD-10-CM | POA: Diagnosis not present

## 2018-10-20 ENCOUNTER — Ambulatory Visit (INDEPENDENT_AMBULATORY_CARE_PROVIDER_SITE_OTHER): Payer: 59 | Admitting: Endocrinology

## 2018-10-20 ENCOUNTER — Encounter: Payer: Self-pay | Admitting: Endocrinology

## 2018-10-20 ENCOUNTER — Other Ambulatory Visit: Payer: Self-pay

## 2018-10-20 VITALS — BP 122/70 | HR 99 | Ht 64.0 in | Wt 204.8 lb

## 2018-10-20 DIAGNOSIS — E0590 Thyrotoxicosis, unspecified without thyrotoxic crisis or storm: Secondary | ICD-10-CM

## 2018-10-20 LAB — T4, FREE: Free T4: 0.69 ng/dL (ref 0.60–1.60)

## 2018-10-20 LAB — TSH: TSH: 0.84 u[IU]/mL (ref 0.35–4.50)

## 2018-10-20 MED ORDER — METHIMAZOLE 10 MG PO TABS: 10.0000 mg | ORAL_TABLET | Freq: Every day | ORAL | 3 refills | Status: DC

## 2018-10-20 MED FILL — methIMAzole 10 MG TABS: 10 | 30 days supply | Qty: 30 | Fill #0

## 2018-10-20 MED FILL — LEVOCETIRIZINE 5 MG TABLET: 5 | 90 days supply | Qty: 90 | Fill #1

## 2018-10-20 NOTE — Progress Notes (Signed)
Subjective:    Patient ID: Beth King, female    DOB: 1976/02/10, 42 y.o.   MRN: [id number]  HPI Pt returns for f/u of hyperthyroidism (dx'ed 2017; prob due to [place] dz, but she has never had thyroid imaging; she has IUD; she chose tapazole rx).  pt states she feels well in general.  Specifically, she denies tremor.  She has not considering a pregnancy.   Past Medical History:  Diagnosis Date  . Allergic rhinitis   . Enlarged uterus   . Fibroid   . Hyperthyroidism   . Migraine   . Plantar fasciitis   . Pre-diabetes   . Seasonal allergies     No past surgical history on file.  Social History   Socioeconomic History  . Marital status: Married    Spouse name: Not on file  . Number of children: 0  . Years of education: Not on file  . Highest education level: Associate degree: occupational, Hotel manager, or vocational program  Occupational History  . Not on file  Social Needs  . Financial resource strain: Not on file  . Food insecurity:    Worry: Not on file    Inability: Not on file  . Transportation needs:    Medical: Not on file    Non-medical: Not on file  Tobacco Use  . Smoking status: Never Smoker  . Smokeless tobacco: Never Used  Substance and Sexual Activity  . Alcohol use: Yes    Comment: Occassionally   . Drug use: No  . Sexual activity: Yes    Birth control/protection: IUD  Lifestyle  . Physical activity:    Days per week: Not on file    Minutes per session: Not on file  . Stress: Not on file  Relationships  . Social connections:    Talks on phone: Not on file    Gets together: Not on file    Attends religious service: Not on file    Active member of club or organization: Not on file    Attends meetings of clubs or organizations: Not on file    Relationship status: Not on file  . Intimate partner violence:    Fear of current or ex partner: Not on file    Emotionally abused: Not on file    Physically abused: Not on file    Forced sexual  activity: Not on file  Other Topics Concern  . Not on file  Social History Narrative   Jehovah's Witness   Lives at home with husband and mother     Right handed   Drinks occasional caffeine    Current Outpatient Medications on File Prior to Visit  Medication Sig Dispense Refill  . cyclobenzaprine (FLEXERIL) 10 MG tablet Take 1 tablet (10 mg total) by mouth 2 (two) times daily as needed for muscle spasms. 20 tablet 0  . ibuprofen (ADVIL,MOTRIN) 200 MG tablet Take 200 mg by mouth every 6 (six) hours as needed.    Marland Kitchen levocetirizine (XYZAL) 5 MG tablet Take 5 mg by mouth every evening. Reported on 01/11/2016    . levonorgestrel (MIRENA) 20 MCG/24HR IUD 1 each by Intrauterine route once.    . meclizine (ANTIVERT) 25 MG tablet Take 1 tablet (25 mg total) by mouth 3 (three) times daily as needed for dizziness. 20 tablet 0  . methocarbamol (ROBAXIN) 500 MG tablet Take 1 tablet (500 mg total) by mouth 2 (two) times daily. 20 tablet 0  . Multiple Vitamin (MULTIVITAMIN WITH MINERALS) TABS Take  1 tablet by mouth daily.    . naproxen (NAPROSYN) 500 MG tablet Take 1 tablet (500 mg total) by mouth 2 (two) times daily. 30 tablet 0  . naratriptan (AMERGE) 2.5 MG tablet Take one (1) tab at onset of headache, may taken an additional dose in 2 hours. Do not take more than 2 pills (5 mg)  in 24 hours. 10 tablet 11  . Topiramate ER (QUDEXY XR) 100 MG CS24 sprinkle capsule Take 100 mg by mouth at bedtime. 30 each 11  . Topiramate ER (QUDEXY XR) 50 MG CS24 sprinkle capsule Take 1 capsule (50 mg) by mouth every night for 7 days then take 2 capsules (100 mg total) every night. 90 each 0   No current facility-administered medications on file prior to visit.     Allergies  Allergen Reactions  . Chloroquine Phosphate [Chloroquine] Itching    "itching all over my body for almost like a week"    Family History  Problem Relation Age of Onset  . Arthritis Father   . Diabetes Other   . Hypertension Other   .  Thyroid disease Neg Hx     BP 122/70 (BP Location: Right Arm, Patient Position: Sitting, Cuff Size: Large)   Pulse 99   Ht 5\' 4"  (1.626 m)   Wt 204 lb 12.8 oz (92.9 kg)   SpO2 97%   BMI 35.15 kg/m    Review of Systems Denies fever.      Objective:   Physical Exam VITAL SIGNS:  See vs page.   GENERAL: no distress.   NECK: thyroid is 2-3 times normal size (R>L).  no palpable nodule.   Skin: not diaphoretic.   Neuro: no tremor.    Lab Results  Component Value Date   TSH 0.84 10/20/2018      Assessment & Plan:  Hyperthyroidism: well-controlled.  Migraine: in this setting, she needs to maintain euthyroidism  Patient Instructions  blood tests are requested for you today.  We'll let you know about the results.   if ever you have fever while taking methimazole, stop it and call us, even if the reason is obvious, because of the risk of a rare side-effect.   Please come back for a follow-up appointment in 6 months.

## 2018-10-20 NOTE — Telephone Encounter (Signed)
Pt requested refill of Tapazole be sent to [facility] pharmacy. Sent today

## 2018-10-20 NOTE — Patient Instructions (Signed)
blood tests are requested for you today.  We'll let you know about the results.   if ever you have fever while taking methimazole, stop it and call us, even if the reason is obvious, because of the risk of a rare side-effect.   Please come back for a follow-up appointment in 6 months.    

## 2018-11-03 ENCOUNTER — Encounter

## 2018-11-03 ENCOUNTER — Ambulatory Visit: Payer: 59 | Admitting: Adult Health

## 2018-11-04 DIAGNOSIS — D259 Leiomyoma of uterus, unspecified: Secondary | ICD-10-CM | POA: Diagnosis not present

## 2018-11-04 DIAGNOSIS — Z3040 Encounter for surveillance of contraceptives, unspecified: Secondary | ICD-10-CM | POA: Diagnosis not present

## 2018-11-04 DIAGNOSIS — Z124 Encounter for screening for malignant neoplasm of cervix: Secondary | ICD-10-CM | POA: Diagnosis not present

## 2018-11-04 DIAGNOSIS — Z1231 Encounter for screening mammogram for malignant neoplasm of breast: Secondary | ICD-10-CM | POA: Diagnosis not present

## 2018-11-04 DIAGNOSIS — Z01411 Encounter for gynecological examination (general) (routine) with abnormal findings: Secondary | ICD-10-CM | POA: Diagnosis not present

## 2018-11-04 DIAGNOSIS — Z6835 Body mass index (BMI) 35.0-35.9, adult: Secondary | ICD-10-CM | POA: Diagnosis not present

## 2018-11-04 DIAGNOSIS — N920 Excessive and frequent menstruation with regular cycle: Secondary | ICD-10-CM | POA: Diagnosis not present

## 2018-11-04 DIAGNOSIS — E0590 Thyrotoxicosis, unspecified without thyrotoxic crisis or storm: Secondary | ICD-10-CM | POA: Diagnosis not present

## 2019-03-23 MED FILL — LEVOCETIRIZINE 5 MG TABLET: 5 | 90 days supply | Qty: 90 | Fill #0

## 2019-04-03 MED FILL — OLOPATADINE HCL 0.1 % SOLN: 0.1 | 30 days supply | Qty: 5 | Fill #0

## 2019-04-20 ENCOUNTER — Other Ambulatory Visit: Payer: Self-pay

## 2019-04-20 ENCOUNTER — Encounter: Payer: Self-pay | Admitting: Endocrinology

## 2019-04-20 ENCOUNTER — Ambulatory Visit (INDEPENDENT_AMBULATORY_CARE_PROVIDER_SITE_OTHER): Payer: No Typology Code available for payment source | Admitting: Endocrinology

## 2019-04-20 VITALS — BP 110/62 | HR 103 | Ht 64.0 in | Wt 212.6 lb

## 2019-04-20 DIAGNOSIS — E0590 Thyrotoxicosis, unspecified without thyrotoxic crisis or storm: Secondary | ICD-10-CM | POA: Diagnosis not present

## 2019-04-20 LAB — T4, FREE: Free T4: 0.88 ng/dL (ref 0.60–1.60)

## 2019-04-20 LAB — TSH: TSH: 1.15 u[IU]/mL (ref 0.35–4.50)

## 2019-04-20 NOTE — Patient Instructions (Signed)
blood tests are requested for you today.  We'll let you know about the results.   if ever you have fever while taking methimazole, stop it and call us, even if the reason is obvious, because of the risk of a rare side-effect.   Please come back for a follow-up appointment in 6 months.

## 2019-04-20 NOTE — Progress Notes (Signed)
Subjective:    Patient ID: Beth King, female    DOB: 05-Jun-1976, 43 y.o.   MRN: [id number]  HPI Pt returns for f/u of hyperthyroidism (dx'ed 2017; prob due to [place] dz, but she has never had thyroid imaging; she has IUD; she chose tapazole rx).  pt states she feels well in general.  Specifically, she denies tremor.  She has not considering a pregnancy.   Past Medical History:  Diagnosis Date  . Allergic rhinitis   . Enlarged uterus   . Fibroid   . Hyperthyroidism   . Migraine   . Plantar fasciitis   . Pre-diabetes   . Seasonal allergies     No past surgical history on file.  Social History   Socioeconomic History  . Marital status: Married    Spouse name: Not on file  . Number of children: 0  . Years of education: Not on file  . Highest education level: Associate degree: occupational, Hotel manager, or vocational program  Occupational History  . Not on file  Social Needs  . Financial resource strain: Not on file  . Food insecurity:    Worry: Not on file    Inability: Not on file  . Transportation needs:    Medical: Not on file    Non-medical: Not on file  Tobacco Use  . Smoking status: Never Smoker  . Smokeless tobacco: Never Used  Substance and Sexual Activity  . Alcohol use: Yes    Comment: Occassionally   . Drug use: No  . Sexual activity: Yes    Birth control/protection: I.U.D.  Lifestyle  . Physical activity:    Days per week: Not on file    Minutes per session: Not on file  . Stress: Not on file  Relationships  . Social connections:    Talks on phone: Not on file    Gets together: Not on file    Attends religious service: Not on file    Active member of club or organization: Not on file    Attends meetings of clubs or organizations: Not on file    Relationship status: Not on file  . Intimate partner violence:    Fear of current or ex partner: Not on file    Emotionally abused: Not on file    Physically abused: Not on file    Forced sexual  activity: Not on file  Other Topics Concern  . Not on file  Social History Narrative   Jehovah's Witness   Lives at home with husband and mother     Right handed   Drinks occasional caffeine    Current Outpatient Medications on File Prior to Visit  Medication Sig Dispense Refill  . cyclobenzaprine (FLEXERIL) 10 MG tablet Take 1 tablet (10 mg total) by mouth 2 (two) times daily as needed for muscle spasms. 20 tablet 0  . ibuprofen (ADVIL,MOTRIN) 200 MG tablet Take 200 mg by mouth every 6 (six) hours as needed.    Marland Kitchen levocetirizine (XYZAL) 5 MG tablet Take 5 mg by mouth every evening. Reported on 01/11/2016    . levonorgestrel (MIRENA) 20 MCG/24HR IUD 1 each by Intrauterine route once.    . meclizine (ANTIVERT) 25 MG tablet Take 1 tablet (25 mg total) by mouth 3 (three) times daily as needed for dizziness. 20 tablet 0  . methimazole (TAPAZOLE) 10 MG tablet Take 1 tablet (10 mg total) by mouth daily. 30 tablet 3  . methocarbamol (ROBAXIN) 500 MG tablet Take 1 tablet (500 mg  total) by mouth 2 (two) times daily. 20 tablet 0  . Multiple Vitamin (MULTIVITAMIN WITH MINERALS) TABS Take 1 tablet by mouth daily.    . naproxen (NAPROSYN) 500 MG tablet Take 1 tablet (500 mg total) by mouth 2 (two) times daily. 30 tablet 0  . naratriptan (AMERGE) 2.5 MG tablet Take one (1) tab at onset of headache, may taken an additional dose in 2 hours. Do not take more than 2 pills (5 mg)  in 24 hours. 10 tablet 11  . Topiramate ER (QUDEXY XR) 100 MG CS24 sprinkle capsule Take 100 mg by mouth at bedtime. 30 each 11  . Topiramate ER (QUDEXY XR) 50 MG CS24 sprinkle capsule Take 1 capsule (50 mg) by mouth every night for 7 days then take 2 capsules (100 mg total) every night. 90 each 0   No current facility-administered medications on file prior to visit.     Allergies  Allergen Reactions  . Chloroquine Phosphate [Chloroquine] Itching    "itching all over my body for almost like a week"    Family History  Problem  Relation Age of Onset  . Arthritis Father   . Diabetes Other   . Hypertension Other   . Thyroid disease Neg Hx     BP 110/62 (BP Location: Left Arm, Patient Position: Sitting, Cuff Size: Large)   Pulse (!) 103   Ht 5\' 4"  (1.626 m)   Wt 212 lb 9.6 oz (96.4 kg)   SpO2 98%   BMI 36.49 kg/m    Review of Systems Denies fever.      Objective:   Physical Exam VITAL SIGNS:  See vs page GENERAL: no distress NECK: thyroid is slightly and diffusely enlarged.  No thyroid nodule is palpable.  No palpable lymphadenopathy at the anterior neck.     Lab Results  Component Value Date   TSH 1.15 04/20/2019      Assessment & Plan:  Hyperthyroidism: well-controlled.  Please continue the same medication.   Migraine: in this setting, she should maintain euthyroidism

## 2019-09-13 MED FILL — methIMAzole 10 MG TABS: 10 | 30 days supply | Qty: 30 | Fill #1

## 2019-09-13 MED FILL — LEVOCETIRIZINE 5 MG TABLET: 5 | 90 days supply | Qty: 90 | Fill #0

## 2019-10-19 ENCOUNTER — Other Ambulatory Visit: Payer: Self-pay

## 2019-10-19 ENCOUNTER — Ambulatory Visit: Payer: No Typology Code available for payment source | Admitting: Endocrinology

## 2019-10-21 ENCOUNTER — Ambulatory Visit (INDEPENDENT_AMBULATORY_CARE_PROVIDER_SITE_OTHER): Payer: No Typology Code available for payment source | Admitting: Endocrinology

## 2019-10-21 VITALS — BP 102/58 | HR 97 | Ht 64.0 in | Wt 210.0 lb

## 2019-10-21 DIAGNOSIS — E0590 Thyrotoxicosis, unspecified without thyrotoxic crisis or storm: Secondary | ICD-10-CM | POA: Diagnosis not present

## 2019-10-21 LAB — T4, FREE: Free T4: 0.84 ng/dL (ref 0.60–1.60)

## 2019-10-21 LAB — TSH: TSH: 2.25 u[IU]/mL (ref 0.35–4.50)

## 2019-10-21 NOTE — Progress Notes (Signed)
Subjective:    Patient ID: Beth King, female    DOB: Jun 22, 1976, 43 y.o.   MRN: [id number]  HPI Pt returns for f/u of hyperthyroidism (dx'ed 2017; prob due to [place] dz, but she has never had thyroid imaging; she has IUD; she chose tapazole rx).  pt states she feels well in general, except weight gain.   She has not considering a pregnancy.   Past Medical History:  Diagnosis Date  . Allergic rhinitis   . Enlarged uterus   . Fibroid   . Hyperthyroidism   . Migraine   . Plantar fasciitis   . Pre-diabetes   . Seasonal allergies     No past surgical history on file.  Social History   Socioeconomic History  . Marital status: Married    Spouse name: Not on file  . Number of children: 0  . Years of education: Not on file  . Highest education level: Associate degree: occupational, Hotel manager, or vocational program  Occupational History  . Not on file  Social Needs  . Financial resource strain: Not on file  . Food insecurity    Worry: Not on file    Inability: Not on file  . Transportation needs    Medical: Not on file    Non-medical: Not on file  Tobacco Use  . Smoking status: Never Smoker  . Smokeless tobacco: Never Used  Substance and Sexual Activity  . Alcohol use: Yes    Comment: Occassionally   . Drug use: No  . Sexual activity: Yes    Birth control/protection: I.U.D.  Lifestyle  . Physical activity    Days per week: Not on file    Minutes per session: Not on file  . Stress: Not on file  Relationships  . Social Herbalist on phone: Not on file    Gets together: Not on file    Attends religious service: Not on file    Active member of club or organization: Not on file    Attends meetings of clubs or organizations: Not on file    Relationship status: Not on file  . Intimate partner violence    Fear of current or ex partner: Not on file    Emotionally abused: Not on file    Physically abused: Not on file    Forced sexual activity: Not on file   Other Topics Concern  . Not on file  Social History Narrative   Jehovah's Witness   Lives at home with husband and mother     Right handed   Drinks occasional caffeine    Current Outpatient Medications on File Prior to Visit  Medication Sig Dispense Refill  . ibuprofen (ADVIL,MOTRIN) 200 MG tablet Take 200 mg by mouth every 6 (six) hours as needed.    Marland Kitchen levocetirizine (XYZAL) 5 MG tablet Take 5 mg by mouth every evening. Reported on 01/11/2016    . methimazole (TAPAZOLE) 10 MG tablet Take 1 tablet (10 mg total) by mouth daily. 30 tablet 3  . Multiple Vitamin (MULTIVITAMIN WITH MINERALS) TABS Take 1 tablet by mouth daily.     No current facility-administered medications on file prior to visit.     Allergies  Allergen Reactions  . Chloroquine Phosphate [Chloroquine] Itching    "itching all over my body for almost like a week"    Family History  Problem Relation Age of Onset  . Arthritis Father   . Diabetes Other   . Hypertension Other   .  Thyroid disease Neg Hx     BP (!) 102/58 (BP Location: Right Arm, Patient Position: Sitting, Cuff Size: Large)   Pulse 97   Ht 5\' 4"  (1.626 m)   Wt 210 lb (95.3 kg)   SpO2 98%   BMI 36.05 kg/m    Review of Systems Denies palpitations    Objective:   Physical Exam VITAL SIGNS:  See vs page GENERAL: no distress NECK: thyroid is slightly enlarged  No thyroid nodule is palpable.  No palpable lymphadenopathy at the anterior neck.   Lab Results  Component Value Date   TSH 2.25 10/21/2019      Assessment & Plan:  Hyperthyroidism: well-controlled. Please continue the same medication Migraine: in this setting, she needs to maintain euthyroidism.

## 2019-10-21 NOTE — Patient Instructions (Addendum)
Blood tests are requested for you today.  We'll let you know about the results.  If ever you have fever while taking methimazole, stop it and call us, even if the reason is obvious, because of the risk of a rare side-effect.  Please come back for a follow-up appointment in 6 months.   

## 2019-12-06 MED FILL — VIT D2 1.25 MG (50,000 UNIT: 1.25 MG | 56 days supply | Qty: 8 | Fill #0

## 2019-12-22 MED FILL — LEVOCETIRIZINE 5 MG TABLET: 5 | 90 days supply | Qty: 90 | Fill #1

## 2019-12-22 MED FILL — METFORMIN HCL ER 500 MG TB2: 500 | 90 days supply | Qty: 90 | Fill #0

## 2019-12-22 MED FILL — MEFLOQUINE HCL 250 MG TAB: 250 | 42 days supply | Qty: 6 | Fill #0

## 2020-01-17 ENCOUNTER — Other Ambulatory Visit: Payer: Self-pay | Admitting: Endocrinology

## 2020-01-17 MED FILL — methIMAzole 10 MG TABS: 10 | 30 days supply | Qty: 30 | Fill #0

## 2020-04-07 MED FILL — METFORMIN HCL ER 500 MG TB2: 500 | 90 days supply | Qty: 90 | Fill #1

## 2020-04-21 ENCOUNTER — Other Ambulatory Visit: Payer: Self-pay

## 2020-04-25 ENCOUNTER — Other Ambulatory Visit: Payer: Self-pay

## 2020-04-25 ENCOUNTER — Ambulatory Visit: Payer: No Typology Code available for payment source | Admitting: Endocrinology

## 2020-04-25 ENCOUNTER — Encounter: Payer: Self-pay | Admitting: Endocrinology

## 2020-04-25 VITALS — BP 118/72 | HR 94 | Ht 64.0 in | Wt 209.0 lb

## 2020-04-25 DIAGNOSIS — E0590 Thyrotoxicosis, unspecified without thyrotoxic crisis or storm: Secondary | ICD-10-CM | POA: Diagnosis not present

## 2020-04-25 DIAGNOSIS — R635 Abnormal weight gain: Secondary | ICD-10-CM

## 2020-04-25 LAB — TSH: TSH: 0.9 u[IU]/mL (ref 0.35–4.50)

## 2020-04-25 LAB — T4, FREE: Free T4: 0.78 ng/dL (ref 0.60–1.60)

## 2020-04-25 NOTE — Progress Notes (Signed)
Subjective:    Patient ID: Beth King, female    DOB: Sep 18, 1976, 44 y.o.   MRN: [id number]  HPI Pt returns for f/u of hyperthyroidism (dx'ed 2017; prob due to [place] dz, but she has never had thyroid imaging; she has IUD; she chose tapazole rx).  pt states she feels well in general, except for ongoing weight gain.  She has not considering a pregnancy.   Past Medical History:  Diagnosis Date  . Allergic rhinitis   . Enlarged uterus   . Fibroid   . Hyperthyroidism   . Migraine   . Plantar fasciitis   . Pre-diabetes   . Seasonal allergies     No past surgical history on file.  Social History   Socioeconomic History  . Marital status: Married    Spouse name: Not on file  . Number of children: 0  . Years of education: Not on file  . Highest education level: Associate degree: occupational, Hotel manager, or vocational program  Occupational History  . Not on file  Tobacco Use  . Smoking status: Never Smoker  . Smokeless tobacco: Never Used  Substance and Sexual Activity  . Alcohol use: Yes    Comment: Occassionally   . Drug use: No  . Sexual activity: Yes    Birth control/protection: I.U.D.  Other Topics Concern  . Not on file  Social History Narrative   Jehovah's Witness   Lives at home with husband and mother     Right handed   Drinks occasional caffeine   Social Determinants of Health   Financial Resource Strain:   . Difficulty of Paying Living Expenses:   Food Insecurity:   . Worried About Charity fundraiser in the Last Year:   . Arboriculturist in the Last Year:   Transportation Needs:   . Film/video editor (Medical):   Marland Kitchen Lack of Transportation (Non-Medical):   Physical Activity:   . Days of Exercise per Week:   . Minutes of Exercise per Session:   Stress:   . Feeling of Stress :   Social Connections:   . Frequency of Communication with Friends and Family:   . Frequency of Social Gatherings with Friends and Family:   . Attends Religious  Services:   . Active Member of Clubs or Organizations:   . Attends Archivist Meetings:   Marland Kitchen Marital Status:   Intimate Partner Violence:   . Fear of Current or Ex-Partner:   . Emotionally Abused:   Marland Kitchen Physically Abused:   . Sexually Abused:     Current Outpatient Medications on File Prior to Visit  Medication Sig Dispense Refill  . ibuprofen (ADVIL,MOTRIN) 200 MG tablet Take 200 mg by mouth every 6 (six) hours as needed.    Marland Kitchen levocetirizine (XYZAL) 5 MG tablet Take 5 mg by mouth every evening. Reported on 01/11/2016    . methimazole (TAPAZOLE) 10 MG tablet TAKE 1 TABLET (10 MG TOTAL) BY MOUTH DAILY. 30 tablet 3  . Multiple Vitamin (MULTIVITAMIN WITH MINERALS) TABS Take 1 tablet by mouth daily.     No current facility-administered medications on file prior to visit.    Allergies  Allergen Reactions  . Chloroquine Phosphate [Chloroquine] Itching    "itching all over my body for almost like a week"    Family History  Problem Relation Age of Onset  . Arthritis Father   . Diabetes Other   . Hypertension Other   . Thyroid disease Neg Hx  BP 118/72   Pulse 94   Ht 5\' 4"  (1.626 m)   Wt 209 lb (94.8 kg)   SpO2 97%   BMI 35.87 kg/m    Review of Systems She denies fever    Objective:   Physical Exam VITAL SIGNS:  See vs page.  GENERAL: no distress.  NECK: thyroid is slightly and diffusely enlarged.    Lab Results  Component Value Date   TSH 0.90 04/25/2020      Assessment & Plan:  Hyperthyroidism: well-controlled. Please continue the same medication. Overweight, persistent.   Patient Instructions  Blood tests are requested for you today.  We'll let you know about the results.  If ever you have fever while taking methimazole, stop it and call us, even if the reason is obvious, because of the risk of a rare side-effect. It is best to never miss the medication.  However, if you do miss it, next best is to double up the next time.   Please see a weight  loss specialist.  you will receive a phone call, about a day and time for an appointment. Please come back for a follow-up appointment in 6 months.

## 2020-04-25 NOTE — Patient Instructions (Signed)
Blood tests are requested for you today.  We'll let you know about the results.  If ever you have fever while taking methimazole, stop it and call us, even if the reason is obvious, because of the risk of a rare side-effect. It is best to never miss the medication.  However, if you do miss it, next best is to double up the next time.   Please see a weight loss specialist.  you will receive a phone call, about a day and time for an appointment. Please come back for a follow-up appointment in 6 months.

## 2020-07-24 MED FILL — METFORMIN HCL ER 500 MG TB2: 500 | 90 days supply | Qty: 90 | Fill #2

## 2020-08-11 MED FILL — NAPROXEN 500 MG TABLET: 500 | 14 days supply | Qty: 28 | Fill #0

## 2020-10-24 ENCOUNTER — Encounter: Payer: Self-pay | Admitting: Endocrinology

## 2020-10-24 ENCOUNTER — Other Ambulatory Visit: Payer: Self-pay

## 2020-10-24 ENCOUNTER — Ambulatory Visit (INDEPENDENT_AMBULATORY_CARE_PROVIDER_SITE_OTHER): Payer: No Typology Code available for payment source | Admitting: Endocrinology

## 2020-10-24 VITALS — BP 120/74 | HR 82 | Ht 64.0 in | Wt 210.0 lb

## 2020-10-24 DIAGNOSIS — E0590 Thyrotoxicosis, unspecified without thyrotoxic crisis or storm: Secondary | ICD-10-CM

## 2020-10-24 LAB — T4, FREE: Free T4: 0.76 ng/dL (ref 0.60–1.60)

## 2020-10-24 LAB — TSH: TSH: 0.65 u[IU]/mL (ref 0.35–4.50)

## 2020-10-24 NOTE — Progress Notes (Signed)
Subjective:    Patient ID: Beth King, female    DOB: March 24, 1976, 44 y.o.   MRN: [id number]  HPI Pt returns for f/u of hyperthyroidism (dx'ed 2017; prob due to [place] dz, but she has never had thyroid imaging; she chose tapazole rx).  pt states she feels well in general.  Pt says she is at risk for pregnancy.  Past Medical History:  Diagnosis Date  . Allergic rhinitis   . Enlarged uterus   . Fibroid   . Hyperthyroidism   . Migraine   . Plantar fasciitis   . Pre-diabetes   . Seasonal allergies     No past surgical history on file.  Social History   Socioeconomic History  . Marital status: Married    Spouse name: Not on file  . Number of children: 0  . Years of education: Not on file  . Highest education level: Associate degree: occupational, Hotel manager, or vocational program  Occupational History  . Not on file  Tobacco Use  . Smoking status: Never Smoker  . Smokeless tobacco: Never Used  Vaping Use  . Vaping Use: Never used  Substance and Sexual Activity  . Alcohol use: Yes    Comment: Occassionally   . Drug use: No  . Sexual activity: Yes    Birth control/protection: I.U.D.  Other Topics Concern  . Not on file  Social History Narrative   Jehovah's Witness   Lives at home with husband and mother     Right handed   Drinks occasional caffeine   Social Determinants of Health   Financial Resource Strain:   . Difficulty of Paying Living Expenses: Not on file  Food Insecurity:   . Worried About Charity fundraiser in the Last Year: Not on file  . Ran Out of Food in the Last Year: Not on file  Transportation Needs:   . Lack of Transportation (Medical): Not on file  . Lack of Transportation (Non-Medical): Not on file  Physical Activity:   . Days of Exercise per Week: Not on file  . Minutes of Exercise per Session: Not on file  Stress:   . Feeling of Stress : Not on file  Social Connections:   . Frequency of Communication with Friends and Family: Not on  file  . Frequency of Social Gatherings with Friends and Family: Not on file  . Attends Religious Services: Not on file  . Active Member of Clubs or Organizations: Not on file  . Attends Archivist Meetings: Not on file  . Marital Status: Not on file  Intimate Partner Violence:   . Fear of Current or Ex-Partner: Not on file  . Emotionally Abused: Not on file  . Physically Abused: Not on file  . Sexually Abused: Not on file    Current Outpatient Medications on File Prior to Visit  Medication Sig Dispense Refill  . ibuprofen (ADVIL,MOTRIN) 200 MG tablet Take 200 mg by mouth every 6 (six) hours as needed.    Marland Kitchen levocetirizine (XYZAL) 5 MG tablet Take 5 mg by mouth every evening. Reported on 01/11/2016    . methimazole (TAPAZOLE) 10 MG tablet TAKE 1 TABLET (10 MG TOTAL) BY MOUTH DAILY. 30 tablet 3  . Multiple Vitamin (MULTIVITAMIN WITH MINERALS) TABS Take 1 tablet by mouth daily.     No current facility-administered medications on file prior to visit.    Allergies  Allergen Reactions  . Chloroquine Phosphate [Chloroquine] Itching    "itching all over my body  for almost like a week"    Family History  Problem Relation Age of Onset  . Arthritis Father   . Diabetes Other   . Hypertension Other   . Thyroid disease Neg Hx     BP 120/74   Pulse 82   Ht 5\' 4"  (1.626 m)   Wt 210 lb (95.3 kg)   LMP 09/25/2020   SpO2 98%   BMI 36.05 kg/m    Review of Systems Denies fever.      Objective:   Physical Exam VITAL SIGNS:  See vs page GENERAL: no distress NECK: thyroid is slightly and diffusely enlarged.   Lab Results  Component Value Date   TSH 0.65 10/24/2020       Assessment & Plan:  Hyperthyroidism: well-controlled.  Please continue the same methimazole

## 2020-10-24 NOTE — Patient Instructions (Signed)
Blood tests are requested for you today.  We'll let you know about the results.  If ever you have fever while taking methimazole, stop it and call us, even if the reason is obvious, because of the risk of a rare side-effect.  It is best to never miss the medication.  However, if you do miss it, next best is to double up the next time.   Please come back for a follow-up appointment in 6 months.  Please call sooner if the pregnancy happens.

## 2020-11-07 ENCOUNTER — Other Ambulatory Visit (HOSPITAL_COMMUNITY): Payer: Self-pay | Admitting: Obstetrics and Gynecology

## 2020-11-07 MED FILL — METFORMIN HCL ER 500 MG TB2: 500 | 90 days supply | Qty: 90 | Fill #0

## 2021-01-23 ENCOUNTER — Telehealth: Payer: Self-pay | Admitting: Endocrinology

## 2021-01-23 NOTE — Telephone Encounter (Signed)
D/c methimazole F/u ov next avail

## 2021-01-23 NOTE — Telephone Encounter (Signed)
Patient called to advise that she is [redacted] weeks pregnant and that her OB-GYN has advised her that she can not take Methimazole.  Patient requesting a call back to find out how to proceed - call back # [phone]

## 2021-01-23 NOTE — Telephone Encounter (Signed)
Please advise 

## 2021-01-24 NOTE — Telephone Encounter (Signed)
Notified pt D/c methamizole and appt set for 03/06/21 @ 1:00  with Dr. Loanne Drilling

## 2021-02-05 ENCOUNTER — Other Ambulatory Visit (HOSPITAL_COMMUNITY): Payer: Self-pay | Admitting: Obstetrics and Gynecology

## 2021-02-05 MED FILL — IBUPROFEN 600 MG TABLET: 600 | 5 days supply | Qty: 20 | Fill #0

## 2021-02-05 MED FILL — OXYCODONE-APAP 5-325MG: 5-325 | 5 days supply | Qty: 20 | Fill #0

## 2021-02-05 MED FILL — miSOPROStol 200 MCG TABS: 200 | 2 days supply | Qty: 8 | Fill #0

## 2021-02-06 MED FILL — METFORMIN HCL ER 500 MG TB2: 500 | 90 days supply | Qty: 90 | Fill #1

## 2021-02-08 ENCOUNTER — Other Ambulatory Visit (HOSPITAL_COMMUNITY): Payer: Self-pay | Admitting: Obstetrics and Gynecology

## 2021-02-08 MED FILL — miSOPROStol 200 MCG TABS: 200 | 2 days supply | Qty: 8 | Fill #0

## 2021-02-13 ENCOUNTER — Other Ambulatory Visit (HOSPITAL_COMMUNITY): Payer: Self-pay | Admitting: Obstetrics and Gynecology

## 2021-02-13 MED FILL — IBUPROFEN 600 MG TABLET: 600 | 8 days supply | Qty: 30 | Fill #0

## 2021-02-13 MED FILL — OXYCODONE-APAP 5-325MG: 5-325 | 5 days supply | Qty: 20 | Fill #0

## 2021-02-15 ENCOUNTER — Other Ambulatory Visit (HOSPITAL_COMMUNITY): Payer: Self-pay | Admitting: Obstetrics and Gynecology

## 2021-02-15 MED FILL — diazePAM 2 MG TABS: 2 | 1 days supply | Qty: 2 | Fill #0

## 2021-03-06 ENCOUNTER — Other Ambulatory Visit: Payer: Self-pay

## 2021-03-06 ENCOUNTER — Ambulatory Visit (INDEPENDENT_AMBULATORY_CARE_PROVIDER_SITE_OTHER): Payer: No Typology Code available for payment source | Admitting: Endocrinology

## 2021-03-06 VITALS — BP 146/78 | HR 101 | Ht 64.0 in | Wt 214.0 lb

## 2021-03-06 DIAGNOSIS — E0590 Thyrotoxicosis, unspecified without thyrotoxic crisis or storm: Secondary | ICD-10-CM

## 2021-03-06 LAB — T4, FREE: Free T4: 0.78 ng/dL (ref 0.60–1.60)

## 2021-03-06 LAB — TSH: TSH: 0.54 u[IU]/mL (ref 0.35–4.50)

## 2021-03-06 NOTE — Patient Instructions (Addendum)
Blood tests are requested for you today.  We'll let you know about the results.  Based on the results, I'll prescribe for you a different type of thyroid pill.   If ever you have fever while taking this medication, stop it and call us, even if the reason is obvious, because of the risk of a rare side-effect.  It is best to never miss the medication.  However, if you do miss it, next best is to double up the next time.   Please come back for a follow-up appointment in 3 months.  Please call sooner if the pregnancy happens.

## 2021-03-06 NOTE — Progress Notes (Signed)
   Subjective:    Patient ID: Beth King, female    DOB: [birth year], 45 y.o.   MRN: [id number]  HPI Pt returns for f/u of hyperthyroidism (dx'ed 2017; prob due to [place] dz, but she has never had thyroid imaging; she chose tapazole rx).  pt stopped tapazole 1-2 mos ago, due to pregnancy.  She suffered a miscarriage 1 month ago.  She will soon start attempting another pregnancy.   Past Medical History:  Diagnosis Date  . Allergic rhinitis   . Enlarged uterus   . Fibroid   . Hyperthyroidism   . Migraine   . Plantar fasciitis   . Pre-diabetes   . Seasonal allergies     No past surgical history on file.  Social History   Socioeconomic History  . Marital status: Married    Spouse name: Not on file  . Number of children: 0  . Years of education: Not on file  . Highest education level: Associate degree: occupational, Hotel manager, or vocational program  Occupational History  . Not on file  Tobacco Use  . Smoking status: Never Smoker  . Smokeless tobacco: Never Used  Vaping Use  . Vaping Use: Never used  Substance and Sexual Activity  . Alcohol use: Yes    Comment: Occassionally   . Drug use: No  . Sexual activity: Yes    Birth control/protection: I.U.D.  Other Topics Concern  . Not on file  Social History Narrative   Jehovah's Witness   Lives at home with husband and mother     Right handed   Drinks occasional caffeine   Social Determinants of Health   Financial Resource Strain: Not on file  Food Insecurity: Not on file  Transportation Needs: Not on file  Physical Activity: Not on file  Stress: Not on file  Social Connections: Not on file  Intimate Partner Violence: Not on file    Current Outpatient Medications on File Prior to Visit  Medication Sig Dispense Refill  . ibuprofen (ADVIL,MOTRIN) 200 MG tablet Take 200 mg by mouth every 6 (six) hours as needed.    Marland Kitchen levocetirizine (XYZAL) 5 MG tablet Take 5 mg by mouth every evening. Reported on 01/11/2016    .  Multiple Vitamin (MULTIVITAMIN WITH MINERALS) TABS Take 1 tablet by mouth daily.     No current facility-administered medications on file prior to visit.    Allergies  Allergen Reactions  . Chloroquine Phosphate [Chloroquine] Itching    "itching all over my body for almost like a week"    Family History  Problem Relation Age of Onset  . Arthritis Father   . Diabetes Other   . Hypertension Other   . Thyroid disease Neg Hx     BP (!) 146/78 (BP Location: Right Arm, Patient Position: Sitting, Cuff Size: Large)   Pulse (!) 101   Ht 5\' 4"  (1.626 m)   Wt 214 lb (97.1 kg)   SpO2 97%   BMI 36.73 kg/m    Review of Systems Denies fever.      Objective:   Physical Exam VITAL SIGNS:  See vs page GENERAL: no distress NECK: thyroid is slightly and diffusely enlarged.    Lab Results  Component Value Date   TSH 0.54 03/06/2021       Assessment & Plan:  Hyperthyroidism: well-controlled, due to residual effect of the methimazole.  Fertility: we'll change to PTU.  I have sent a prescription to your pharmacy.

## 2021-03-07 ENCOUNTER — Other Ambulatory Visit: Payer: Self-pay | Admitting: Endocrinology

## 2021-03-07 MED ORDER — PROPYLTHIOURACIL 50 MG PO TABS: 100.0000 mg | ORAL_TABLET | Freq: Every day | ORAL | 3 refills | Status: DC

## 2021-03-21 ENCOUNTER — Other Ambulatory Visit (HOSPITAL_COMMUNITY): Payer: Self-pay

## 2021-03-21 MED ORDER — LEVOCETIRIZINE DIHYDROCHLORIDE 5 MG PO TABS
5.0000 mg | ORAL_TABLET | Freq: Every day | ORAL | 0 refills | Status: DC
  Filled 2021-03-21 – 2021-05-16 (×2): qty 90, 90d supply, fill #0

## 2021-03-23 ENCOUNTER — Other Ambulatory Visit (HOSPITAL_COMMUNITY): Payer: Self-pay

## 2021-03-30 ENCOUNTER — Other Ambulatory Visit (HOSPITAL_COMMUNITY): Payer: Self-pay

## 2021-04-19 NOTE — Pre-Procedure Instructions (Signed)
Surgical Instructions    Your procedure is scheduled on Tuesday, May 17th.  Report to [facility] Main Entrance "A" at 5:30 A.M., then check in with the Admitting office.  Call this number if you have problems the morning of surgery:  [phone]   If you have any questions prior to your surgery date call [phone]: Open Monday-Friday 8am-4pm    Remember:  Do not eat after midnight the night before your surgery  You may drink clear liquids until 4:30 the morning of your surgery.   Clear liquids allowed are: Water, Non-Citrus Juices (without pulp), Carbonated Beverages, Clear Tea, Black Coffee Only, and Gatorade. (Please choose sugar-free or diet options)    Take these medicines the morning of surgery with A SIP OF WATER  acetaminophen (TYLENOL)-as needed levocetirizine (XYZAL)-as needed  As of today, STOP taking any Aspirin (unless otherwise instructed by your surgeon) Aleve, Naproxen, Ibuprofen, Motrin, Advil, Goody's, BC's, all herbal medications, fish oil, and all vitamins.           WHAT DO I DO ABOUT MY DIABETES MEDICATION?   Marland Kitchen Do not take metFORMIN (GLUCOPHAGE-XR) the morning of surgery.   HOW TO MANAGE YOUR DIABETES BEFORE AND AFTER SURGERY  Why is it important to control my blood sugar before and after surgery? . Improving blood sugar levels before and after surgery helps healing and can limit problems. . A way of improving blood sugar control is eating a healthy diet by: o  Eating less sugar and carbohydrates o  Increasing activity/exercise o  Talking with your doctor about reaching your blood sugar goals . High blood sugars (greater than 180 mg/dL) can raise your risk of infections and slow your recovery, so you will need to focus on controlling your diabetes during the weeks before surgery. . Make sure that the doctor who takes care of your diabetes knows about your planned surgery including the date and location.  How do I manage my blood sugar before  surgery? . Check your blood sugar at least 4 times a day, starting 2 days before surgery, to make sure that the level is not too high or low. . Check your blood sugar the morning of your surgery when you wake up and every 2 hours until you get to the Short Stay unit. o If your blood sugar is less than 70 mg/dL, you will need to treat for low blood sugar: - Do not take insulin. - Treat a low blood sugar (less than 70 mg/dL) with  cup of clear juice (cranberry or apple), 4 glucose tablets, OR glucose gel. - Recheck blood sugar in 15 minutes after treatment (to make sure it is greater than 70 mg/dL). If your blood sugar is not greater than 70 mg/dL on recheck, call [phone] for further instructions. . Report your blood sugar to the short stay nurse when you get to Short Stay.  . If you are admitted to the hospital after surgery: o Your blood sugar will be checked by the staff and you will probably be given insulin after surgery (instead of oral diabetes medicines) to make sure you have good blood sugar levels. o The goal for blood sugar control after surgery is 80-180 mg/dL.             Do NOT Smoke (Tobacco/Vaping) or drink Alcohol 24 hours prior to your procedure.  If you use a CPAP at night, you may bring all equipment for your overnight stay.   Contacts, glasses, piercing's, hearing aid's, dentures  or partials may not be worn into surgery, please bring cases for these belongings.    For patients admitted to the hospital, discharge time will be determined by your treatment team.   Patients discharged the day of surgery will not be allowed to drive home, and someone needs to stay with them for 24 hours.    Special instructions:   [place]- Preparing For Surgery  Before surgery, you can play an important role. Because skin is not sterile, your skin needs to be as free of germs as possible. You can reduce the number of germs on your skin by washing with CHG (chlorahexidine  gluconate) Soap before surgery.  CHG is an antiseptic cleaner which kills germs and bonds with the skin to continue killing germs even after washing.    Oral Hygiene is also important to reduce your risk of infection.  Remember - BRUSH YOUR TEETH THE MORNING OF SURGERY WITH YOUR REGULAR TOOTHPASTE  Please do not use if you have an allergy to CHG or antibacterial soaps. If your skin becomes reddened/irritated stop using the CHG.  Do not shave (including legs and underarms) for at least 48 hours prior to first CHG shower. It is OK to shave your face.  Please follow these instructions carefully.   1. Shower the NIGHT BEFORE SURGERY and the MORNING OF SURGERY  2. If you chose to wash your hair, wash your hair first as usual with your normal shampoo.  3. After you shampoo, rinse your hair and body thoroughly to remove the shampoo.  4. Use CHG Soap as you would any other liquid soap. You can apply CHG directly to the skin and wash gently with a scrungie or a clean washcloth.   5. Apply the CHG Soap to your body ONLY FROM THE NECK DOWN.  Do not use on open wounds or open sores. Avoid contact with your eyes, ears, mouth and genitals (private parts). Wash Face and genitals (private parts)  with your normal soap.   6. Wash thoroughly, paying special attention to the area where your surgery will be performed.  7. Thoroughly rinse your body with warm water from the neck down.  8. DO NOT shower/wash with your normal soap after using and rinsing off the CHG Soap.  9. Pat yourself dry with a CLEAN TOWEL.  10. Wear CLEAN PAJAMAS to bed the night before surgery  11. Place CLEAN SHEETS on your bed the night before your surgery  12. DO NOT SLEEP WITH PETS.   Day of Surgery: Shower with CHG soap. Do not wear jewelry, make up, or nail polish Do not wear lotions, powders, perfumes, or deodorant. Do not shave 48 hours prior to surgery.  Do not bring valuables to the hospital. [facility] is not  responsible for any belongings or valuables. Wear Clean/Comfortable clothing the morning of surgery Remember to brush your teeth WITH YOUR REGULAR TOOTHPASTE.   Please read over the following fact sheets that you were given.

## 2021-04-20 ENCOUNTER — Other Ambulatory Visit: Payer: Self-pay

## 2021-04-20 ENCOUNTER — Encounter (HOSPITAL_COMMUNITY): Payer: Self-pay

## 2021-04-20 ENCOUNTER — Encounter (HOSPITAL_COMMUNITY)
Admission: RE | Admit: 2021-04-20 | Discharge: 2021-04-20 | Disposition: A | Discharge status: Home / Self Care | Payer: No Typology Code available for payment source | Source: Ambulatory Visit | Attending: Obstetrics and Gynecology | Admitting: Obstetrics and Gynecology

## 2021-04-20 DIAGNOSIS — Z01818 Encounter for other preprocedural examination: Secondary | ICD-10-CM | POA: Diagnosis not present

## 2021-04-20 DIAGNOSIS — Z20822 Contact with and (suspected) exposure to covid-19: Secondary | ICD-10-CM | POA: Diagnosis not present

## 2021-04-20 LAB — CBC
HCT: 42.3 % (ref 36.0–46.0)
Hemoglobin: 13.5 g/dL (ref 12.0–15.0)
MCH: 29.1 pg (ref 26.0–34.0)
MCHC: 31.9 g/dL (ref 30.0–36.0)
MCV: 91.2 fL (ref 80.0–100.0)
Platelets: 330 10*3/uL (ref 150–400)
RBC: 4.64 MIL/uL (ref 3.87–5.11)
RDW: 14.5 % (ref 11.5–15.5)
WBC: 6.5 10*3/uL (ref 4.0–10.5)
nRBC: 0 % (ref 0.0–0.2)

## 2021-04-20 LAB — GLUCOSE, CAPILLARY: Glucose-Capillary: 91 mg/dL (ref 70–99)

## 2021-04-20 LAB — HEMOGLOBIN A1C
Hgb A1c MFr Bld: 6.6 % — ABNORMAL HIGH (ref 4.8–5.6)
Mean Plasma Glucose: 142.72 mg/dL

## 2021-04-20 LAB — NO BLOOD PRODUCTS

## 2021-04-20 NOTE — Progress Notes (Signed)
PCP - Donald Prose, MD Cardiologist - Denies  PPM/ICD - Denies   Chest x-ray - N/A EKG - 04/20/2021.  Hx of cardiomygalia Stress Test - Denies ECHO -  Denies Cardiac Cath -  Denies  Sleep Study -  Denies CPAP - N/A  Hx of prediabetes. Pt is on Metformin, denies checking CBGs.   Blood Thinner Instructions:N/A Aspirin Instructions:N/A  ERAS Protcol - No   COVID TEST- 04/20/2021.    Anesthesia review: Yes, Abnormal EKG.  Patient denies shortness of breath, fever, cough and chest pain at PAT appointment   All instructions explained to the patient, with a verbal understanding of the material. Patient agrees to go over the instructions while at home for a better understanding. Patient also instructed to self quarantine after being tested for COVID-19. The opportunity to ask questions was provided.

## 2021-04-21 LAB — SARS CORONAVIRUS 2 (TAT 6-24 HRS): SARS Coronavirus 2: NEGATIVE

## 2021-04-21 NOTE — H&P (Signed)
Beth King is an 45 y.o. female , G1P0010, here for scheduled abdominal myomectomy.  Pt had a recent first trimester miscarriage in 02/2021.  US done while pregnant noted multiple intramural fibroids with the largest being 7cm ( fundal) ; rest were about 1- 2cm in size. Pt has regular monthly menses but they are associated with severe dysmenorrhea Pt is interested in preserving fertility. 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    Past Medical History:  Diagnosis Date  . Allergic rhinitis   . Cardiomegaly 2007   incidental finding, no tx required per pt.  . Enlarged uterus   . Fibroid   . Hyperthyroidism   . Migraine   . Plantar fasciitis   . Pre-diabetes   . Seasonal allergies     No past surgical history on file.  Family History  Problem Relation Age of Onset  . Arthritis Father   . Diabetes Other   . Hypertension Other   . Thyroid disease Neg Hx     Social History:  reports that she has never smoked. She has never used smokeless tobacco. She reports current alcohol use. She reports that she does not use drugs.  Allergies:  Allergies  Allergen Reactions  . Chloroquine Phosphate [Chloroquine] Itching    "itching all over my body for almost like a week"    No medications prior to admission.    Review of Systems  Constitutional: Negative for activity change, appetite change and fatigue.  Eyes: Negative for photophobia and visual disturbance.  Respiratory: Negative for choking, chest tightness and  shortness of breath.   Cardiovascular: Negative for chest pain, palpitations and leg swelling.  Gastrointestinal: Negative for abdominal pain.  Genitourinary: Positive for menstrual problem (dysmenorrhea).  Musculoskeletal: Negative for back pain.  Neurological: Negative for light-headedness, numbness and headaches.  Psychiatric/Behavioral: The patient is nervous/anxious.     Last menstrual period 04/13/2021. Physical Exam Vitals and nursing note reviewed. Exam conducted with a chaperone present.  Constitutional:      Appearance: Normal appearance. She is obese.  Cardiovascular:     Rate and Rhythm: Normal rate.  Pulmonary:     Effort: Pulmonary effort is normal.  Abdominal:     Palpations: Abdomen is soft.  Genitourinary:    General: Normal vulva.  Musculoskeletal:        General: Normal range of motion.     Cervical back: Normal range of motion.  Skin:    General: Skin is warm.     Capillary Refill: Capillary refill takes 2 to 3 seconds.  Neurological:     General: No focal deficit present.     Mental Status: She is alert and oriented to person, place, and time. Mental status is at baseline.  Psychiatric:        Mood and Affect: Mood normal.        Behavior: Behavior normal.        Thought Content: Thought content normal.        Judgment: Judgment normal.     No results found for this or any previous visit (from the past 24 hour(s)).  No results found.  Assessment/Plan: 45yo G1P0010 female here for scheduled abdominal myomectomy -Admit -ERAS protocol -Lysteda preop -Antibiotics prn -Verify consent   Isaiah Serge 04/21/2021, 12:15 PM

## 2021-04-23 NOTE — Anesthesia Preprocedure Evaluation (Addendum)
Anesthesia Evaluation  Patient identified by MRN, date of birth, ID band Patient awake    Reviewed: Allergy & Precautions, NPO status , Patient's Chart, lab work & pertinent test results  Airway Mallampati: II       Dental no notable dental hx.    Pulmonary neg pulmonary ROS,    Pulmonary exam normal        Cardiovascular negative cardio ROS Normal cardiovascular exam     Neuro/Psych  Headaches, negative psych ROS   GI/Hepatic negative GI ROS, Neg liver ROS,   Endo/Other  diabetes, Type 2, Oral Hypoglycemic AgentsHyperthyroidism   Renal/GU negative Renal ROS  negative genitourinary   Musculoskeletal   Abdominal (+) + obese,   Peds  Hematology negative hematology ROS (+)   Anesthesia Other Findings   Reproductive/Obstetrics                            Anesthesia Physical Anesthesia Plan  ASA: II  Anesthesia Plan: General   Post-op Pain Management:    Induction: Intravenous  PONV Risk Score and Plan: 4 or greater and Ondansetron, Midazolam and Treatment may vary due to age or medical condition  Airway Management Planned: Oral ETT  Additional Equipment: None  Intra-op Plan:   Post-operative Plan: Extubation in OR  Informed Consent: I have reviewed the patients History and Physical, chart, labs and discussed the procedure including the risks, benefits and alternatives for the proposed anesthesia with the patient or authorized representative who has indicated his/her understanding and acceptance.     Dental advisory given  Plan Discussed with: CRNA  Anesthesia Plan Comments: (PAT note written 04/23/2021 by Myra Gianotti, PA-C.  She has signed a refusal of all blood and blood products. CBC normal on 04/20/21.)      Anesthesia Quick Evaluation

## 2021-04-23 NOTE — Progress Notes (Signed)
Anesthesia Chart Review:  Case: [id number] Date/Time: 04/24/21 0715   Procedure: ABDOMINAL MYOMECTOMY (N/A )   Anesthesia type: General   Pre-op diagnosis: fibroids   Location: [facility] OR ROOM 07 / [place] OR   Surgeons: Sherlyn Hay, DO      DISCUSSION: Patient is a 45 year old female scheduled for the above procedure.  History includes never smoker, hyperthyroidism, migraines, fibroids, pre-diabetes, cardiomegaly (reported as incidental finding from 2007; "Top-normal cardiac size" on 01/30/18 CXR). BMI is consistent with obesity.  Per endocrinology note by Dr. 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  pt stopped tapazole 1-2 mos ago, due to pregnancy.  She suffered a miscarriage 1 month ago.  She will soon start attempting another pregnancy." Hyperthyroidism was well-controlled; however changed to PTU for fertility.   Preoperative labs show a normal CBC. She has signed a refusal of all blood and blood products.   Presurgical COVID-19 test negative on 04/20/2021. Anesthesia team to evaluate on the day of surgery. She is for pregnancy test on the day of surgery.    VS: BP (!) 130/92   Pulse 81   Temp 37.1 C (Oral)   Ht 5\' 4"  (1.626 m)   Wt 96.7 kg   LMP 04/13/2021 (Approximate)   SpO2 100%   BMI 36.59 kg/m   PROVIDERS: Donald Prose, MD is PCP  Renato Shin, MD is endocrinologist   LABS: Labs reviewed: Acceptable for surgery. A1c 6.6% (up from 6.4% on 06/08/20).  TSH 0.54 & Free T4 0.78 on 03/06/21. (all labs ordered are listed, but only abnormal results are displayed)  Labs Reviewed  HEMOGLOBIN A1C - Abnormal; Notable for the following components:      Result Value   Hgb A1c MFr Bld 6.6 (*)    All other components within normal limits  SARS CORONAVIRUS 2 (TAT 6-24 HRS)  GLUCOSE, CAPILLARY  CBC  NO BLOOD PRODUCTS     EKG: 04/20/21: Normal sinus rhythm Cannot  rule out Anterior infarct , age undetermined Abnormal ECG Confirmed by Virl Axe [phone]) on 04/21/2021 1:06:44 PM   CV: N/A  Past Medical History:  Diagnosis Date  . Allergic rhinitis   . Cardiomegaly 2007   incidental finding, no tx required per pt.  . Enlarged uterus   . Fibroid   . Hyperthyroidism   . Migraine   . Plantar fasciitis   . Pre-diabetes   . Seasonal allergies     History reviewed. No pertinent surgical history.  MEDICATIONS: . acetaminophen (TYLENOL) 500 MG tablet  . diazepam (VALIUM) 2 MG tablet  . folic acid (FOLVITE) 742 MCG tablet  . ibuprofen (ADVIL) 600 MG tablet  . ibuprofen (ADVIL) 600 MG tablet  . levocetirizine (XYZAL) 5 MG tablet  . metFORMIN (GLUCOPHAGE-XR) 500 MG 24 hr tablet  . misoprostol (CYTOTEC) 200 MCG tablet  . misoprostol (CYTOTEC) 200 MCG tablet  . oxyCODONE-acetaminophen (PERCOCET/ROXICET) 5-325 MG tablet  . oxyCODONE-acetaminophen (PERCOCET/ROXICET) 5-325 MG tablet  . Prenatal Vit-Fe Fumarate-FA (PRENATAL MULTIVITAMIN) TABS tablet  . propylthiouracil (PTU) 50 MG tablet   No current facility-administered medications for this encounter.     Myra Gianotti, PA-C Surgical Short Stay/Anesthesiology [facility] Phone [phone] [facility] Phone [phone] 04/23/2021 9:33 AM

## 2021-04-24 ENCOUNTER — Other Ambulatory Visit: Payer: Self-pay

## 2021-04-24 ENCOUNTER — Ambulatory Visit: Payer: No Typology Code available for payment source | Admitting: Endocrinology

## 2021-04-24 ENCOUNTER — Encounter (HOSPITAL_COMMUNITY)
Admission: RE | Disposition: A | Discharge status: Home / Self Care | Payer: Self-pay | Source: Home / Self Care | Attending: Obstetrics and Gynecology

## 2021-04-24 ENCOUNTER — Inpatient Hospital Stay (HOSPITAL_COMMUNITY)
Admission: RE | Admit: 2021-04-24 | Discharge: 2021-04-25 | DRG: 743 | Disposition: A | Discharge status: Home / Self Care | Payer: No Typology Code available for payment source | Attending: Obstetrics and Gynecology | Admitting: Obstetrics and Gynecology

## 2021-04-24 ENCOUNTER — Encounter (HOSPITAL_COMMUNITY): Payer: Self-pay | Admitting: Obstetrics and Gynecology

## 2021-04-24 ENCOUNTER — Inpatient Hospital Stay (HOSPITAL_COMMUNITY): Payer: No Typology Code available for payment source | Admitting: Vascular Surgery

## 2021-04-24 ENCOUNTER — Inpatient Hospital Stay (HOSPITAL_COMMUNITY): Payer: No Typology Code available for payment source | Admitting: Anesthesiology

## 2021-04-24 DIAGNOSIS — D250 Submucous leiomyoma of uterus: Secondary | ICD-10-CM | POA: Diagnosis present

## 2021-04-24 DIAGNOSIS — E119 Type 2 diabetes mellitus without complications: Secondary | ICD-10-CM | POA: Diagnosis present

## 2021-04-24 DIAGNOSIS — N946 Dysmenorrhea, unspecified: Secondary | ICD-10-CM | POA: Diagnosis present

## 2021-04-24 DIAGNOSIS — Z7984 Long term (current) use of oral hypoglycemic drugs: Secondary | ICD-10-CM | POA: Diagnosis not present

## 2021-04-24 DIAGNOSIS — E0590 Thyrotoxicosis, unspecified without thyrotoxic crisis or storm: Secondary | ICD-10-CM | POA: Diagnosis present

## 2021-04-24 DIAGNOSIS — Z20822 Contact with and (suspected) exposure to covid-19: Secondary | ICD-10-CM | POA: Diagnosis present

## 2021-04-24 DIAGNOSIS — E669 Obesity, unspecified: Secondary | ICD-10-CM | POA: Diagnosis present

## 2021-04-24 DIAGNOSIS — D252 Subserosal leiomyoma of uterus: Secondary | ICD-10-CM | POA: Diagnosis present

## 2021-04-24 DIAGNOSIS — Z9889 Other specified postprocedural states: Secondary | ICD-10-CM

## 2021-04-24 DIAGNOSIS — Z6836 Body mass index (BMI) 36.0-36.9, adult: Secondary | ICD-10-CM

## 2021-04-24 DIAGNOSIS — D259 Leiomyoma of uterus, unspecified: Secondary | ICD-10-CM | POA: Diagnosis present

## 2021-04-24 HISTORY — PX: MYOMECTOMY: SHX85

## 2021-04-24 LAB — POCT PREGNANCY, URINE: Preg Test, Ur: NEGATIVE

## 2021-04-24 LAB — GLUCOSE, CAPILLARY: Glucose-Capillary: 116 mg/dL — ABNORMAL HIGH (ref 70–99)

## 2021-04-24 SURGERY — MYOMECTOMY, ABDOMINAL APPROACH
Anesthesia: General

## 2021-04-24 MED ORDER — ONDANSETRON HCL 4 MG PO TABS: 4.0000 mg | ORAL_TABLET | Freq: Four times a day (QID) | ORAL | Status: DC | PRN

## 2021-04-24 MED ORDER — CEFAZOLIN SODIUM-DEXTROSE 2-3 GM-%(50ML) IV SOLR
INTRAVENOUS | Status: DC | PRN
  Administered 2021-04-24: 2 g via INTRAVENOUS

## 2021-04-24 MED ORDER — ACETAMINOPHEN 500 MG PO TABS
1000.0000 mg | ORAL_TABLET | Freq: Four times a day (QID) | ORAL | Status: DC
  Administered 2021-04-24 – 2021-04-25 (×4): 1000 mg via ORAL
  Filled 2021-04-24 (×4): qty 2

## 2021-04-24 MED ORDER — DEXAMETHASONE SODIUM PHOSPHATE 10 MG/ML IJ SOLN
INTRAMUSCULAR | Status: DC | PRN
  Administered 2021-04-24: 5 mg via INTRAVENOUS

## 2021-04-24 MED ORDER — PROMETHAZINE HCL 25 MG/ML IJ SOLN: 6.2500 mg | INTRAMUSCULAR | Status: DC | PRN

## 2021-04-24 MED ORDER — ACETAMINOPHEN 10 MG/ML IV SOLN: 1000.0000 mg | Freq: Once | INTRAVENOUS | Status: DC | PRN

## 2021-04-24 MED ORDER — VASOPRESSIN 20 UNIT/ML IV SOLN
INTRAVENOUS | Status: AC
  Filled 2021-04-24: qty 1

## 2021-04-24 MED ORDER — OXYCODONE HCL 5 MG PO TABS
5.0000 mg | ORAL_TABLET | ORAL | Status: DC | PRN
  Administered 2021-04-24: 10 mg via ORAL
  Filled 2021-04-24: qty 2

## 2021-04-24 MED ORDER — LORATADINE 10 MG PO TABS
10.0000 mg | ORAL_TABLET | Freq: Every day | ORAL | Status: DC
  Administered 2021-04-24 – 2021-04-25 (×2): 10 mg via ORAL
  Filled 2021-04-24 (×2): qty 1

## 2021-04-24 MED ORDER — HYDROMORPHONE HCL 1 MG/ML IJ SOLN
0.2500 mg | INTRAMUSCULAR | Status: DC | PRN
  Administered 2021-04-24 (×2): 0.5 mg via INTRAVENOUS

## 2021-04-24 MED ORDER — MORPHINE SULFATE (PF) 2 MG/ML IV SOLN
1.0000 mg | INTRAVENOUS | Status: DC | PRN
Start: 2021-04-24 — End: 2021-04-25
  Administered 2021-04-24 (×2): 2 mg via INTRAVENOUS
  Filled 2021-04-24 (×2): qty 1

## 2021-04-24 MED ORDER — KETOROLAC TROMETHAMINE 30 MG/ML IJ SOLN
INTRAMUSCULAR | Status: AC
  Filled 2021-04-24: qty 1

## 2021-04-24 MED ORDER — ORAL CARE MOUTH RINSE: 15.0000 mL | Freq: Once | OROMUCOSAL | Status: AC

## 2021-04-24 MED ORDER — ACETAMINOPHEN 10 MG/ML IV SOLN
INTRAVENOUS | Status: AC
  Filled 2021-04-24: qty 100

## 2021-04-24 MED ORDER — CHLORHEXIDINE GLUCONATE 0.12 % MT SOLN
15.0000 mL | Freq: Once | OROMUCOSAL | Status: AC
  Administered 2021-04-24: 15 mL via OROMUCOSAL
  Filled 2021-04-24: qty 15

## 2021-04-24 MED ORDER — MENTHOL 3 MG MT LOZG: 1.0000 | LOZENGE | OROMUCOSAL | Status: DC | PRN

## 2021-04-24 MED ORDER — MEPERIDINE HCL 25 MG/ML IJ SOLN: 6.2500 mg | INTRAMUSCULAR | Status: DC | PRN

## 2021-04-24 MED ORDER — MIDAZOLAM HCL 5 MG/5ML IJ SOLN
INTRAMUSCULAR | Status: DC | PRN
  Administered 2021-04-24: 2 mg via INTRAVENOUS

## 2021-04-24 MED ORDER — POVIDONE-IODINE 10 % EX SWAB: 2.0000 "application " | Freq: Once | CUTANEOUS | Status: DC

## 2021-04-24 MED ORDER — ONDANSETRON HCL 4 MG/2ML IJ SOLN
INTRAMUSCULAR | Status: DC | PRN
  Administered 2021-04-24: 4 mg via INTRAVENOUS

## 2021-04-24 MED ORDER — FENTANYL CITRATE (PF) 100 MCG/2ML IJ SOLN
INTRAMUSCULAR | Status: DC | PRN
  Administered 2021-04-24: 100 ug via INTRAVENOUS
  Administered 2021-04-24 (×3): 50 ug via INTRAVENOUS

## 2021-04-24 MED ORDER — IBUPROFEN 600 MG PO TABS
600.0000 mg | ORAL_TABLET | Freq: Four times a day (QID) | ORAL | Status: DC
Start: 2021-04-24 — End: 2021-04-25
  Administered 2021-04-24 – 2021-04-25 (×4): 600 mg via ORAL
  Filled 2021-04-24 (×5): qty 1

## 2021-04-24 MED ORDER — ROCURONIUM BROMIDE 10 MG/ML (PF) SYRINGE
PREFILLED_SYRINGE | INTRAVENOUS | Status: DC | PRN
  Administered 2021-04-24: 60 mg via INTRAVENOUS

## 2021-04-24 MED ORDER — HYDROMORPHONE HCL 1 MG/ML IJ SOLN
INTRAMUSCULAR | Status: AC
  Filled 2021-04-24: qty 1

## 2021-04-24 MED ORDER — SODIUM CHLORIDE (PF) 0.9 % IJ SOLN
INTRAMUSCULAR | Status: AC
  Filled 2021-04-24: qty 50

## 2021-04-24 MED ORDER — MIDAZOLAM HCL 2 MG/2ML IJ SOLN
INTRAMUSCULAR | Status: AC
  Filled 2021-04-24: qty 2

## 2021-04-24 MED ORDER — 0.9 % SODIUM CHLORIDE (POUR BTL) OPTIME
TOPICAL | Status: DC | PRN
  Administered 2021-04-24: 2000 mL

## 2021-04-24 MED ORDER — LIDOCAINE 2% (20 MG/ML) 5 ML SYRINGE
INTRAMUSCULAR | Status: AC
  Filled 2021-04-24: qty 5

## 2021-04-24 MED ORDER — SENNA 8.6 MG PO TABS
1.0000 | ORAL_TABLET | Freq: Two times a day (BID) | ORAL | Status: DC
  Administered 2021-04-24 – 2021-04-25 (×3): 8.6 mg via ORAL
  Filled 2021-04-24 (×3): qty 1

## 2021-04-24 MED ORDER — LACTATED RINGERS IV SOLN: INTRAVENOUS | Status: DC

## 2021-04-24 MED ORDER — CEFAZOLIN SODIUM-DEXTROSE 2-4 GM/100ML-% IV SOLN
INTRAVENOUS | Status: AC
  Filled 2021-04-24: qty 100

## 2021-04-24 MED ORDER — ONDANSETRON HCL 4 MG/2ML IJ SOLN
INTRAMUSCULAR | Status: AC
  Filled 2021-04-24: qty 2

## 2021-04-24 MED ORDER — METFORMIN HCL ER 500 MG PO TB24
500.0000 mg | ORAL_TABLET | Freq: Every day | ORAL | Status: DC
  Administered 2021-04-24 – 2021-04-25 (×2): 500 mg via ORAL
  Filled 2021-04-24 (×2): qty 1

## 2021-04-24 MED ORDER — PROPOFOL 10 MG/ML IV BOLUS
INTRAVENOUS | Status: AC
  Filled 2021-04-24: qty 40

## 2021-04-24 MED ORDER — LIDOCAINE 2% (20 MG/ML) 5 ML SYRINGE
INTRAMUSCULAR | Status: DC | PRN
  Administered 2021-04-24: 100 mg via INTRAVENOUS

## 2021-04-24 MED ORDER — VASOPRESSIN 20 UNIT/ML IV SOLN
INTRAVENOUS | Status: DC | PRN
  Administered 2021-04-24: 12 mL via INTRAMUSCULAR

## 2021-04-24 MED ORDER — FENTANYL CITRATE (PF) 250 MCG/5ML IJ SOLN
INTRAMUSCULAR | Status: AC
  Filled 2021-04-24: qty 5

## 2021-04-24 MED ORDER — BUPIVACAINE HCL (PF) 0.25 % IJ SOLN
INTRAMUSCULAR | Status: AC
  Filled 2021-04-24: qty 30

## 2021-04-24 MED ORDER — BUPIVACAINE HCL (PF) 0.25 % IJ SOLN
INTRAMUSCULAR | Status: DC | PRN
  Administered 2021-04-24: 30 mL

## 2021-04-24 MED ORDER — DEXMEDETOMIDINE (PRECEDEX) IN NS 20 MCG/5ML (4 MCG/ML) IV SYRINGE
PREFILLED_SYRINGE | INTRAVENOUS | Status: DC | PRN
  Administered 2021-04-24: 8 ug via INTRAVENOUS

## 2021-04-24 MED ORDER — ONDANSETRON HCL 4 MG/2ML IJ SOLN
4.0000 mg | Freq: Four times a day (QID) | INTRAMUSCULAR | Status: DC | PRN
  Administered 2021-04-24: 4 mg via INTRAVENOUS
  Filled 2021-04-24: qty 2

## 2021-04-24 MED ORDER — SUGAMMADEX SODIUM 200 MG/2ML IV SOLN
INTRAVENOUS | Status: DC | PRN
  Administered 2021-04-24: 200 mg via INTRAVENOUS

## 2021-04-24 MED ORDER — ROCURONIUM BROMIDE 10 MG/ML (PF) SYRINGE
PREFILLED_SYRINGE | INTRAVENOUS | Status: AC
  Filled 2021-04-24: qty 10

## 2021-04-24 MED ORDER — PROPOFOL 10 MG/ML IV BOLUS
INTRAVENOUS | Status: DC | PRN
  Administered 2021-04-24: 200 mg via INTRAVENOUS

## 2021-04-24 MED ORDER — ACETAMINOPHEN 500 MG PO TABS
1000.0000 mg | ORAL_TABLET | ORAL | Status: AC
Start: 2021-04-24 — End: 2021-04-24
  Administered 2021-04-24: 1000 mg via ORAL
  Filled 2021-04-24: qty 2

## 2021-04-24 MED ORDER — SIMETHICONE 80 MG PO CHEW: 80.0000 mg | CHEWABLE_TABLET | Freq: Four times a day (QID) | ORAL | Status: DC | PRN

## 2021-04-24 MED ORDER — DEXAMETHASONE SODIUM PHOSPHATE 10 MG/ML IJ SOLN
INTRAMUSCULAR | Status: AC
  Filled 2021-04-24: qty 1

## 2021-04-24 MED ORDER — TRANEXAMIC ACID-NACL 1000-0.7 MG/100ML-% IV SOLN
1000.0000 mg | INTRAVENOUS | Status: AC
  Administered 2021-04-24: 1000 mg via INTRAVENOUS
  Filled 2021-04-24: qty 100

## 2021-04-24 MED ORDER — KETOROLAC TROMETHAMINE 30 MG/ML IJ SOLN
30.0000 mg | Freq: Once | INTRAMUSCULAR | Status: AC
  Administered 2021-04-24: 30 mg via INTRAVENOUS

## 2021-04-24 MED ORDER — PANTOPRAZOLE SODIUM 40 MG PO TBEC
40.0000 mg | DELAYED_RELEASE_TABLET | Freq: Every day | ORAL | Status: DC
  Administered 2021-04-24 – 2021-04-25 (×2): 40 mg via ORAL
  Filled 2021-04-24 (×2): qty 1

## 2021-04-24 SURGICAL SUPPLY — 48 items
BARRIER ADHS 3X4 INTERCEED (GAUZE/BANDAGES/DRESSINGS) ×2 IMPLANT
BENZOIN TINCTURE PRP APPL 2/3 (GAUZE/BANDAGES/DRESSINGS) ×2 IMPLANT
CANISTER SUCT 3000ML PPV (MISCELLANEOUS) ×2 IMPLANT
CELLS DAT CNTRL 66122 CELL SVR (MISCELLANEOUS) ×1 IMPLANT
DECANTER SPIKE VIAL GLASS SM (MISCELLANEOUS) IMPLANT
DRAPE CESAREAN BIRTH W POUCH (DRAPES) IMPLANT
DRAPE WARM FLUID 44X44 (DRAPES) IMPLANT
DRSG OPSITE POSTOP 4X10 (GAUZE/BANDAGES/DRESSINGS) ×2 IMPLANT
DRSG OPSITE POSTOP 4X6 (GAUZE/BANDAGES/DRESSINGS) ×2 IMPLANT
DURAPREP 26ML APPLICATOR (WOUND CARE) ×2 IMPLANT
GAUZE 4X4 16PLY RFD (DISPOSABLE) IMPLANT
GLOVE BIO SURGEON STRL SZ 6.5 (GLOVE) ×2 IMPLANT
GLOVE SURG UNDER POLY LF SZ7 (GLOVE) ×4 IMPLANT
GOWN STRL REUS W/ TWL LRG LVL3 (GOWN DISPOSABLE) ×3 IMPLANT
GOWN STRL REUS W/TWL LRG LVL3 (GOWN DISPOSABLE) ×3
KIT TURNOVER KIT B (KITS) ×2 IMPLANT
MANIPULATOR UTERINE 7CM CLEARV (MISCELLANEOUS) IMPLANT
NEEDLE HYPO 22GX1.5 SAFETY (NEEDLE) IMPLANT
NEEDLE SPNL 22GX3.5 QUINCKE BK (NEEDLE) IMPLANT
NS IRRIG 1000ML POUR BTL (IV SOLUTION) ×2 IMPLANT
PACK ABDOMINAL GYN (CUSTOM PROCEDURE TRAY) ×2 IMPLANT
PAD ARMBOARD 7.5X6 YLW CONV (MISCELLANEOUS) ×2 IMPLANT
PAD OB MATERNITY 4.3X12.25 (PERSONAL CARE ITEMS) ×2 IMPLANT
PENCIL SMOKE EVACUATOR (MISCELLANEOUS) ×2 IMPLANT
RTRCTR C-SECT PINK 25CM LRG (MISCELLANEOUS) IMPLANT
RTRCTR WOUND ALEXIS 18CM MED (MISCELLANEOUS) ×2
RTRCTR WOUND ALEXIS 18CM SML (INSTRUMENTS)
SAVER CELL AAL HAEMONETICS (INSTRUMENTS) IMPLANT
SEPRAFILM MEMBRANE 5X6 (MISCELLANEOUS) IMPLANT
SPONGE LAP 18X18 RF (DISPOSABLE) ×4 IMPLANT
STRIP CLOSURE SKIN 1/2X4 (GAUZE/BANDAGES/DRESSINGS) ×2 IMPLANT
SUT MON AB 2-0 CT1 36 (SUTURE) ×4 IMPLANT
SUT MON AB 3-0 SH 27 (SUTURE) ×2
SUT MON AB 3-0 SH27 (SUTURE) ×2 IMPLANT
SUT PDS AB 0 CTX 60 (SUTURE) IMPLANT
SUT PLAIN 3 0 CT 1 27 (SUTURE) IMPLANT
SUT VIC AB 0 CT1 18XCR BRD8 (SUTURE) ×1 IMPLANT
SUT VIC AB 0 CT1 36 (SUTURE) IMPLANT
SUT VIC AB 0 CT1 8-18 (SUTURE) ×1
SUT VIC AB 2-0 UR6 27 (SUTURE) IMPLANT
SUT VIC AB 4-0 KS 27 (SUTURE) ×2 IMPLANT
SUT VIC AB 4-0 SH 27 (SUTURE)
SUT VIC AB 4-0 SH 27XBRD (SUTURE) IMPLANT
SYR 30ML LL (SYRINGE) ×2 IMPLANT
SYR CONTROL 10ML LL (SYRINGE) ×4 IMPLANT
TAPE STRIPS DRAPE STRL (GAUZE/BANDAGES/DRESSINGS) ×2 IMPLANT
TOWEL GREEN STERILE FF (TOWEL DISPOSABLE) ×4 IMPLANT
TRAY FOLEY W/BAG SLVR 14FR (SET/KITS/TRAYS/PACK) ×2 IMPLANT

## 2021-04-24 NOTE — Transfer of Care (Signed)
Immediate Anesthesia Transfer of Care Note  Patient: Beth King  Procedure(s) Performed: ABDOMINAL MYOMECTOMY (N/A )  Patient Location: PACU  Anesthesia Type:General  Level of Consciousness: awake and drowsy  Airway & Oxygen Therapy: Patient Spontanous Breathing and Patient connected to face mask oxygen  Post-op Assessment: Report given to RN and Post -op Vital signs reviewed and stable  Post vital signs: Reviewed and stable  Last Vitals:  Vitals Value Taken Time  BP 139/90 04/24/21 0921  Temp    Pulse 73 04/24/21 0925  Resp 19 04/24/21 0925  SpO2 100 % 04/24/21 0925  Vitals shown include unvalidated device data.  Last Pain:  Vitals:   04/24/21 0610  TempSrc: Oral  PainSc:       Patients Stated Pain Goal: 5 (29/93/71 6967)  Complications: No complications documented.

## 2021-04-24 NOTE — Anesthesia Procedure Notes (Addendum)
Procedure Name: Intubation Date/Time: 04/24/2021 7:48 AM Performed by: Bryson Corona, CRNA Pre-anesthesia Checklist: Patient identified, Emergency Drugs available, Suction available and Patient being monitored Patient Re-evaluated:Patient Re-evaluated prior to induction Oxygen Delivery Method: Circle System Utilized Preoxygenation: Pre-oxygenation with 100% oxygen Induction Type: IV induction Ventilation: Mask ventilation without difficulty and Oral airway inserted - appropriate to patient size Laryngoscope Size: Mac and 3 Grade View: Grade I Tube type: Oral Tube size: 7.0 mm Number of attempts: 1 Airway Equipment and Method: Stylet and Oral airway Placement Confirmation: ETT inserted through vocal cords under direct vision,  positive ETCO2 and breath sounds checked- equal and bilateral Secured at: 22 cm Tube secured with: Tape Dental Injury: Teeth and Oropharynx as per pre-operative assessment  Comments: Intubation by Elvia Collum

## 2021-04-24 NOTE — Progress Notes (Signed)
Patient ID: Beth King, female   DOB: 22-Dec-1975, 45 y.o.   MRN: [id number] Pt doing well. She reports dull achy pain but no incisional pain. She has tolerated diet well and passed flatus. She is voiding well per foley. Hurts trying to get in and out of bed as expected. No fever, chills, SOB or CP. VS: 149/93, 93 GEN - NAD ABD - mild distension and guarding but incision c/d/i EXT - no homand   A/P: POD#0 s/p abdominal myomectomy - recovering well         Reviewed findings during surgery          Ordered abdominal binder         Foley to stay till am per pt request         Routine post op care

## 2021-04-24 NOTE — Plan of Care (Signed)

## 2021-04-24 NOTE — Op Note (Signed)
Operative Note    Preoperative Diagnosis:  1. Uterine fibroids 2. Dysmenorrhea   Postoperative Diagnosis: Same    Procedure: Abdominal myomectomy  Surgeon: Mickle Mallory, DO  Assist: Meisinger, T MD  Anesthesia: General  Fluids: LR 675ml EBL: 80ml UOP: 460ml, clear   Findings: large subserosal fundal fibroid approximately 7-8cm size; anterior subserosal fibroid approimately 2-3cm, left lateral fundal fibroid 2-3cm, several smaller submucosal fibroids - about 1-2cm size.    Specimen: multiple uterine fibroids   Procedure Note  Pt was taken to operating room where general anesthesia was administered and found to be adequate. A foley catheter was sterilely placed. Pt was placed in dorsal supine position with her arms at her side.  Her  abdomen was then prepped and draped in sterile fashion and an appropriate timeout performed.  A pfannestial skin incision was made with the scalpel two cm above her pubic symphysis and carried down to the level of the fascia. Small areas of bleeding were coagulated.The fascia was incised in the midline and the incision extended laterally with mayo scissors first undermining the fascia. Next, the superior aspect of the fascia was elevated with kocher clamps and the underlying rectus muscles dissected away. In a similar fashion the lower aspect of the incision was also grasped, elevated and the fascia dissected from the rectus.  The peritoneum was then entered and the incision extended cephalad and caudad. The alexis retractor was then placed and bowels tucked away with a wet lap sponge to ensure good visualization. Pt was placed in trendelenberg.  Gross survey of the pelvic organs noted a small uterus with a large fundal fibroid. A smaller fundal fibroid was noted on the left lateral aspect of the uterus.  The uterus was exteriorized. The left fallopian tube cornua  was noted to be reasonably away from the fibroid. 10ccs of pitressin was injected into the  lateral fibroid andit was excised using bovie cautery. Small areas of bleeding were coagulated.  A similar amount  Of pitressin was injected into the larger fibroid.  It was then sequentially separated from the uterine fundus using a hemostat to carve it away and bovie cautery for hemostasis.   A vasopressin mix was injected into the serosal/fibroid.and a lateral incision made.   At this time, the pitressin mix was injected into the noted anterior fibroid which was also then hemostatically removed. Two smaller underlying submucosal fibroids were subsequently removed. There were two very small , approximately 1cm fibroids noted under the left fallopian tube. Due to their size and precarious positioning, they were left in place.    At this time, the myometrial openings were closed using interrupted stiches of o - vicryl pop offs. The excess serosa was excised and the remainder closed using monocryl suture in a baseball fashion. A this time, copious irrigation was performed with no bleeding noted. Interceed was placed over the repaired incisions.  The previously placed lap sponge and the alexis retractor was removed.  The patient was flattened. The  fascia was closed next with 0-vicryl suture. The subcutaneous layer with 3-0 monocryl and the incision with 4-0 vicryl on a keith needle.  Dressing included benzoin, steristrips and a honeycomb dressing.  Pt tolerated the procedure well with no complications Counts were correct per staff x 3

## 2021-04-24 NOTE — Discharge Instructions (Signed)
Call office with any concerns [phone] 

## 2021-04-24 NOTE — Anesthesia Postprocedure Evaluation (Signed)
Anesthesia Post Note  Patient: Beth King  Procedure(s) Performed: ABDOMINAL MYOMECTOMY (N/A )     Patient location during evaluation: PACU Anesthesia Type: General Level of consciousness: sedated Pain management: pain level controlled Vital Signs Assessment: post-procedure vital signs reviewed and stable Respiratory status: spontaneous breathing Cardiovascular status: stable Postop Assessment: no apparent nausea or vomiting Anesthetic complications: no   No complications documented.  Last Vitals:  Vitals:   04/24/21 0950 04/24/21 1005  BP: 124/85 129/85  Pulse: 73 70  Resp: 16 19  Temp:    SpO2: 100% 99%    Last Pain:  Vitals:   04/24/21 1005  TempSrc:   PainSc: [place]

## 2021-04-24 NOTE — Interval H&P Note (Signed)
History and Physical Interval Note: No change from H/P. Consent verified To OR when ready  04/24/2021 7:33 AM  Beth King  has presented today for surgery, with the diagnosis of fibroids.  The various methods of treatment have been discussed with the patient and family. After consideration of risks, benefits and other options for treatment, the patient has consented to  Procedure(s): ABDOMINAL MYOMECTOMY (N/A) as a surgical intervention.  The patient's history has been reviewed, patient examined, no change in status, stable for surgery.  I have reviewed the patient's chart and labs.  Questions were answered to the patient's satisfaction.     Isaiah Serge

## 2021-04-25 ENCOUNTER — Other Ambulatory Visit (HOSPITAL_COMMUNITY): Payer: Self-pay

## 2021-04-25 ENCOUNTER — Encounter (HOSPITAL_COMMUNITY): Payer: Self-pay | Admitting: Obstetrics and Gynecology

## 2021-04-25 LAB — CBC
HCT: 37.4 % (ref 36.0–46.0)
Hemoglobin: 12.3 g/dL (ref 12.0–15.0)
MCH: 29.8 pg (ref 26.0–34.0)
MCHC: 32.9 g/dL (ref 30.0–36.0)
MCV: 90.6 fL (ref 80.0–100.0)
Platelets: 311 10*3/uL (ref 150–400)
RBC: 4.13 MIL/uL (ref 3.87–5.11)
RDW: 14.3 % (ref 11.5–15.5)
WBC: 11.3 10*3/uL — ABNORMAL HIGH (ref 4.0–10.5)
nRBC: 0 % (ref 0.0–0.2)

## 2021-04-25 LAB — SURGICAL PATHOLOGY

## 2021-04-25 MED ORDER — IBUPROFEN 600 MG PO TABS
600.0000 mg | ORAL_TABLET | Freq: Four times a day (QID) | ORAL | 1 refills | Status: DC | PRN
  Filled 2021-04-25: qty 40, 10d supply, fill #0

## 2021-04-25 MED ORDER — OXYCODONE-ACETAMINOPHEN 5-325 MG PO TABS
1.0000 | ORAL_TABLET | ORAL | 0 refills | Status: AC | PRN
  Filled 2021-04-25: qty 28, 7d supply, fill #0

## 2021-04-25 MED ORDER — SIMETHICONE 80 MG PO CHEW
80.0000 mg | CHEWABLE_TABLET | Freq: Four times a day (QID) | ORAL | 0 refills | Status: DC | PRN
  Filled 2021-04-25: qty 30, 8d supply, fill #0

## 2021-04-25 NOTE — Plan of Care (Signed)
  Problem: Education: Goal: Knowledge of General Education information will improve Description: Including pain rating scale, medication(s)/side effects and non-pharmacologic comfort measures 04/25/2021 1135 by Camillia Herter, RN Outcome: Adequate for Discharge 04/25/2021 1135 by Camillia Herter, RN Outcome: Progressing   Problem: Health Behavior/Discharge Planning: Goal: Ability to manage health-related needs will improve 04/25/2021 1135 by Camillia Herter, RN Outcome: Adequate for Discharge 04/25/2021 1135 by Camillia Herter, RN Outcome: Progressing   Problem: Clinical Measurements: Goal: Ability to maintain clinical measurements within normal limits will improve 04/25/2021 1135 by Camillia Herter, RN Outcome: Adequate for Discharge 04/25/2021 1135 by Camillia Herter, RN Outcome: Progressing Goal: Will remain free from infection 04/25/2021 1135 by Camillia Herter, RN Outcome: Adequate for Discharge 04/25/2021 1135 by Camillia Herter, RN Outcome: Progressing Goal: Diagnostic test results will improve 04/25/2021 1135 by Camillia Herter, RN Outcome: Adequate for Discharge 04/25/2021 1135 by Camillia Herter, RN Outcome: Progressing Goal: Respiratory complications will improve 04/25/2021 1135 by Camillia Herter, RN Outcome: Adequate for Discharge 04/25/2021 1135 by Camillia Herter, RN Outcome: Progressing Goal: Cardiovascular complication will be avoided 04/25/2021 1135 by Camillia Herter, RN Outcome: Adequate for Discharge 04/25/2021 1135 by Camillia Herter, RN Outcome: Progressing   Problem: Activity: Goal: Risk for activity intolerance will decrease 04/25/2021 1135 by Camillia Herter, RN Outcome: Adequate for Discharge 04/25/2021 1135 by Camillia Herter, RN Outcome: Progressing   Problem: Nutrition: Goal: Adequate nutrition will be maintained 04/25/2021 1135 by Camillia Herter, RN Outcome: Adequate for Discharge 04/25/2021 1135 by Camillia Herter, RN Outcome:  Progressing   Problem: Coping: Goal: Level of anxiety will decrease 04/25/2021 1135 by Camillia Herter, RN Outcome: Adequate for Discharge 04/25/2021 1135 by Camillia Herter, RN Outcome: Progressing   Problem: Elimination: Goal: Will not experience complications related to bowel motility 04/25/2021 1135 by Camillia Herter, RN Outcome: Adequate for Discharge 04/25/2021 1135 by Camillia Herter, RN Outcome: Progressing Goal: Will not experience complications related to urinary retention 04/25/2021 1135 by Camillia Herter, RN Outcome: Adequate for Discharge 04/25/2021 1135 by Camillia Herter, RN Outcome: Progressing   Problem: Pain Managment: Goal: General experience of comfort will improve 04/25/2021 1135 by Camillia Herter, RN Outcome: Adequate for Discharge 04/25/2021 1135 by Camillia Herter, RN Outcome: Progressing   Problem: Safety: Goal: Ability to remain free from injury will improve 04/25/2021 1135 by Camillia Herter, RN Outcome: Adequate for Discharge 04/25/2021 1135 by Camillia Herter, RN Outcome: Progressing   Problem: Skin Integrity: Goal: Risk for impaired skin integrity will decrease 04/25/2021 1135 by Camillia Herter, RN Outcome: Adequate for Discharge 04/25/2021 1135 by Camillia Herter, RN Outcome: Progressing

## 2021-04-25 NOTE — Plan of Care (Signed)

## 2021-04-25 NOTE — Discharge Summary (Signed)
Physician Discharge Summary  Patient ID: Beth King MRN: [id number] DOB/AGE: 1976/02/06 45 y.o.  Admit date: 04/24/2021 Discharge date: 04/25/2021  Admission Diagnoses: 1. Uterine fibroids 2. Dysmenorrhea  Discharge Diagnoses:  Active Problems:   Uterine leiomyoma   S/P myomectomy   Status post myomectomy   Discharged Condition: stable  Hospital Course: Pt underwent abdominal myomectomy and on POD#1 felt ready for discharge to home due to stable condition  Consults: None  Significant Diagnostic Studies: labs: cbc wnl  Treatments: IV hydration, analgesia: acetaminophen, Morphine and ibuprofen and oxycodone and surgery: abdominal myomectomy  Discharge Exam: Blood pressure (!) 130/93, pulse 79, temperature 98.4 F (36.9 C), temperature source Oral, resp. rate 20, height 5\' 4"  (1.626 m), weight 97.5 kg, last menstrual period 04/13/2021, SpO2 100 %. General appearance: alert, cooperative and no distress GI: incision intact, clean Incision/Wound:  Disposition: Discharge disposition: 01-Home or Self Care       Discharge Instructions    Call MD for:  difficulty breathing, headache or visual disturbances   Complete by: As directed    Call MD for:  persistant nausea and vomiting   Complete by: As directed    Call MD for:  redness, tenderness, or signs of infection (pain, swelling, redness, odor or green/yellow discharge around incision site)   Complete by: As directed    Call MD for:  severe uncontrolled pain   Complete by: As directed    Call MD for:  temperature >100.4   Complete by: As directed    Diet - low sodium heart healthy   Complete by: As directed    Discharge instructions   Complete by: As directed    Call office with any concerns [phone]   Discharge wound care:   Complete by: As directed    Increase activity slowly   Complete by: As directed      Allergies as of 04/25/2021      Reactions   Chloroquine Phosphate [chloroquine] Itching    "itching all over my body for almost like a week"      Medication List    STOP taking these medications   acetaminophen 500 MG tablet Commonly known as: TYLENOL   diazepam 2 MG tablet Commonly known as: VALIUM   misoprostol 200 MCG tablet Commonly known as: CYTOTEC   prenatal multivitamin Tabs tablet     TAKE these medications   folic acid 409 MCG tablet Commonly known as: FOLVITE Take 800 mcg by mouth daily.   ibuprofen 600 MG tablet Commonly known as: ADVIL Take 1 tablet (600 mg total) by mouth every 6 (six) hours as needed. What changed:   how much to take  how to take this  when to take this  reasons to take this  Another medication with the same name was removed. Continue taking this medication, and follow the directions you see here.   levocetirizine 5 MG tablet Commonly known as: XYZAL Take 1 tablet (5 mg total) by mouth daily as needed.   metFORMIN 500 MG 24 hr tablet Commonly known as: GLUCOPHAGE-XR TAKE 1 TABLET BY MOUTH EVERY DAY What changed: how much to take   oxyCODONE-acetaminophen 5-325 MG tablet Commonly known as: Percocet Take 1 tablet by mouth every 4 (four) hours as needed for up to 7 days for severe pain. What changed:   how much to take  how to take this  when to take this  reasons to take this  Another medication with the same name was removed.  Continue taking this medication, and follow the directions you see here.   propylthiouracil 50 MG tablet Commonly known as: PTU TAKE 2 TABLETS (100 MG TOTAL) BY MOUTH DAILY.   simethicone 80 MG chewable tablet Commonly known as: Gas-X Chew 1 tablet (80 mg total) by mouth every 6 (six) hours as needed for flatulence.            Discharge Care Instructions  (From admission, onward)         Start     Ordered   04/25/21 0000  Discharge wound care:        04/25/21 6433          Follow-up Information    Sherlyn Hay, DO. Schedule an appointment as soon as possible  for a visit in 2 weeks.   Specialty: Obstetrics and Gynecology Why: 2 weeks for incision check and 6 weeks for post operative visit Contact information: [place] [id number] [phone]               Signed: Isaiah Serge 04/25/2021, 9:27 AM

## 2021-04-25 NOTE — Progress Notes (Signed)
1 Day Post-Op Procedure(s) (LRB): ABDOMINAL MYOMECTOMY (N/A)  Subjective: Patient reports tolerating PO, + flatus and no problems voiding.  She is ambulating well, pain well controlled, denies CP or SOB. She requests discharge to home today  Objective: I have reviewed patient's vital signs, intake and output, medications and labs.  General: alert, cooperative and no distress GI: incision: clean and intact Extremities: extremities normal, atraumatic, no cyanosis or edema  Assessment: s/p Procedure(s): ABDOMINAL MYOMECTOMY (N/A): stable  Plan: Discontinue IV fluids Discharge home  Instructions reviewed   LOS: 1 day    Sherlyn Ebbert W Minetta Krisher 04/25/2021, 9:21 AM

## 2021-04-27 ENCOUNTER — Other Ambulatory Visit: Payer: Self-pay | Admitting: *Deleted

## 2021-04-27 ENCOUNTER — Encounter: Payer: Self-pay | Admitting: *Deleted

## 2021-04-27 NOTE — Patient Outreach (Addendum)
[facility]) Care Management  04/27/2021  Beth King December 07, 1976 [id number]   Transition of care call/case closure   Referral received:04/27/21 Initial outreach:04/27/21 Insurance: [employer]   Subjective: Initial successful telephone call to patient's preferred number in order to complete transition of care assessment; 2 HIPAA identifiers verified. Explained purpose of call and completed transition of care assessment.  States she is doing better on today than yesterday, denies post-operative problems, says surgical incisions are unremarkable, states surgical pain well managed with prescribed medications, tolerating diet. She discussed problems with constipation on yesterday contacted surgeon office with recommendation for stool softener, and miralax, discussed foods such as prunes, vegetables adequate fluid intake  to help with constipation. Patient reports tolerating walking more in the home, reports tolerating okay stating she did a little too much yesterday.  Spouse are assisting with her recovery.   Reviewed accessing the following [place] Benefits : She discussed history of preDiabetes taking only a pill and diet modification she is agreeable  a referral to one of the [place] chronic disease management programs. Encouraged review of my active health website/ [facility] wellness benefits for additional resources.  She is unsure if she has the hospital indemnity plan, provided contact number to [employer] to file a claim if needed. Patient is accessing FMLA benefit.  She  uses a [facility] outpatient pharmacy at [place]      Objective:  [name]  was hospitalized at [facility] 5/17-5/18/22  for Myomectomy  Comorbidities include: Hyperthyroidism, prediabetes  She  was discharged to home on 04/25/21  without the need for home health services or DME.   Assessment:  Patient voices good understanding of all discharge instructions.   See transition of care flowsheet for assessment details.   Plan:  Reviewed hospital discharge diagnosis of s/p Myomectomy   and discharge treatment plan using hospital discharge instructions, assessing medication adherence, reviewing problems requiring provider notification, and discussing the importance of follow up with surgeon, primary care provider and/or specialists as directed.  Reviewed [place] healthy lifestyle program information to receive discounted premium for  2023   Step 1: Get  your annual physical  Step 2: Complete your health assessment  Step 3:Identify your current health status and complete the corresponding action step between December 09, 2020 and August 09, 2021.    Using [place] website, with patient verbal consent enrolled patient to participate in [place]'s Active Health Management chronic disease management program.    No ongoing care management needs identified so will close case to [facility] services and route successful outreach letter with [place] Management pamphlet and 24 Hour Nurse Line Magnet to [facility] clinical pool to be mailed to patient's home address.  Thanked patient for their services to [facility].  Joylene Draft, RN, BSN  [place] Management Coordinator  [phone]- Mobile [phone]- Toll Free Main Office

## 2021-05-10 ENCOUNTER — Other Ambulatory Visit (HOSPITAL_COMMUNITY): Payer: Self-pay

## 2021-05-10 MED FILL — Metformin HCl Tab ER 24HR 500 MG: ORAL | 90 days supply | Qty: 90 | Fill #0 | Status: AC

## 2021-05-16 ENCOUNTER — Other Ambulatory Visit (HOSPITAL_COMMUNITY): Payer: Self-pay

## 2021-05-16 MED FILL — Propylthiouracil Tab 50 MG: ORAL | 90 days supply | Qty: 180 | Fill #0 | Status: CN

## 2021-05-24 ENCOUNTER — Other Ambulatory Visit (HOSPITAL_COMMUNITY): Payer: Self-pay

## 2021-06-01 ENCOUNTER — Other Ambulatory Visit: Payer: Self-pay | Admitting: Family Medicine

## 2021-06-01 ENCOUNTER — Ambulatory Visit
Admission: RE | Admit: 2021-06-01 | Discharge: 2021-06-01 | Disposition: A | Discharge status: Home / Self Care | Payer: No Typology Code available for payment source | Source: Ambulatory Visit | Attending: Family Medicine | Admitting: Family Medicine

## 2021-06-01 DIAGNOSIS — R1030 Lower abdominal pain, unspecified: Secondary | ICD-10-CM

## 2021-06-01 MED ORDER — IOPAMIDOL (ISOVUE-300) INJECTION 61%
100.0000 mL | Freq: Once | INTRAVENOUS | Status: AC | PRN
  Administered 2021-06-01: 100 mL via INTRAVENOUS

## 2021-06-04 ENCOUNTER — Ambulatory Visit: Payer: No Typology Code available for payment source | Admitting: Endocrinology

## 2021-06-25 ENCOUNTER — Other Ambulatory Visit (HOSPITAL_COMMUNITY): Payer: Self-pay

## 2021-06-25 MED ORDER — METFORMIN HCL ER 500 MG PO TB24
500.0000 mg | ORAL_TABLET | Freq: Two times a day (BID) | ORAL | 0 refills | Status: DC
  Filled 2021-06-25 – 2021-07-20 (×3): qty 180, 90d supply, fill #0

## 2021-07-06 ENCOUNTER — Encounter (HOSPITAL_COMMUNITY): Payer: Self-pay

## 2021-07-06 ENCOUNTER — Other Ambulatory Visit: Payer: Self-pay

## 2021-07-06 ENCOUNTER — Ambulatory Visit (HOSPITAL_COMMUNITY)
Admission: EM | Admit: 2021-07-06 | Discharge: 2021-07-06 | Disposition: A | Discharge status: Home / Self Care | Payer: No Typology Code available for payment source | Attending: Student | Admitting: Student

## 2021-07-06 DIAGNOSIS — U071 COVID-19: Secondary | ICD-10-CM | POA: Insufficient documentation

## 2021-07-06 MED ORDER — PROMETHAZINE-DM 6.25-15 MG/5ML PO SYRP
5.0000 mL | ORAL_SOLUTION | Freq: Four times a day (QID) | ORAL | 0 refills | Status: DC | PRN
  Filled 2021-07-06: qty 118, 6d supply, fill #0

## 2021-07-06 NOTE — ED Triage Notes (Signed)
Pt c/o body aches, headaches, At home covid test was positive today Interventions: alka seltzer- not helpful

## 2021-07-06 NOTE — Discharge Instructions (Addendum)
-  Promethazine DM cough syrup for congestion/cough. This could make you drowsy, so take at night before bed. -For fevers/chills, bodyaches, headaches- Take Tylenol 1000 mg 3 times daily, and ibuprofen 800 mg 3 times daily with food.  You can take these together, or alternate every 3-4 hours. -With a virus, you're typically contagious for 5-7 days, or as long as you're having fevers.

## 2021-07-06 NOTE — ED Provider Notes (Signed)
[facility]    CSN: [id number] Arrival date & time: 07/06/21  [id number]      History   Chief Complaint Chief Complaint  Patient presents with   Fever    HPI Beth King is a 45 y.o. female presenting with Covid-19-- positive home covid test.  Medical history noncontributory.  Notes headaches, generalized body aches, subjective chills for about 1 day.  She is fully vaccinated for COVID. Alka seltzer providing minimal relief. Denies n/v/d, shortness of breath, chest pain, facial pain, teeth pain, headaches, sore throat, loss of taste/smell, swollen lymph nodes, ear pain. '   HPI  Past Medical History:  Diagnosis Date   Allergic rhinitis    Cardiomegaly 2007   incidental finding, no tx required per pt.   Diabetes mellitus without complication ([place])    Enlarged uterus    Fibroid    Hyperthyroidism    Migraine    Plantar fasciitis    Pre-diabetes    Seasonal allergies     Patient Active Problem List   Diagnosis Date Noted   Uterine leiomyoma 04/24/2021   S/P myomectomy 04/24/2021   Status post myomectomy 04/24/2021   Weight gain 04/25/2020   Polyuria 10/10/2017   Arthralgia 10/15/2016   Plantar fasciitis 10/15/2016   Hyperthyroidism    Migraine    Allergic rhinitis     Past Surgical History:  Procedure Laterality Date   MYOMECTOMY N/A 04/24/2021   Procedure: ABDOMINAL MYOMECTOMY;  Surgeon: Sherlyn Hay, DO;  Location: [facility];  Service: Gynecology;  Laterality: N/A;    OB History   No obstetric history on file.      Home Medications    Prior to Admission medications   Medication Sig Start Date End Date Taking? Authorizing Provider  folic acid (FOLVITE) [id number] MCG tablet Take 800 mcg by mouth daily.   Yes [provider]  metFORMIN (GLUCOPHAGE-XR) 500 MG 24 hr tablet Take 1 tablet (500 mg total) by mouth 2 (two) times daily with a meal. 06/25/21  Yes   promethazine-dextromethorphan (PROMETHAZINE-DM) 6.25-15 MG/5ML syrup Take 5 mLs by  mouth 4 (four) times daily as needed for cough. 07/06/21  Yes Hazel Sams, PA-C  ibuprofen (ADVIL) 600 MG tablet Take 1 tablet (600 mg total) by mouth every 6 (six) hours as needed. 04/25/21   Banga, Bonnee Quin, DO  levocetirizine (XYZAL) 5 MG tablet Take 1 tablet (5 mg total) by mouth daily as needed. 03/21/21     propylthiouracil (PTU) 50 MG tablet TAKE 2 TABLETS (100 MG TOTAL) BY MOUTH DAILY. Patient not taking: No sig reported 03/07/21 03/07/22  Renato Shin, MD  simethicone (GAS-X) 80 MG chewable tablet Chew 1 tablet (80 mg total) by mouth every 6 (six) hours as needed for flatulence. 04/25/21   Sherlyn Hay, DO    Family History Family History  Problem Relation Age of Onset   Arthritis Father    Diabetes Other    Hypertension Other    Thyroid disease Neg Hx     Social History Social History   Tobacco Use   Smoking status: Never   Smokeless tobacco: Never  Vaping Use   Vaping Use: Never used  Substance Use Topics   Alcohol use: Yes    Comment: Occassionally    Drug use: No     Allergies   Chloroquine phosphate [chloroquine]   Review of Systems Review of Systems  Constitutional:  Positive for chills. Negative for appetite change and fever.  HENT:  Positive  for congestion. Negative for ear pain, rhinorrhea, sinus pressure, sinus pain and sore throat.   Eyes:  Negative for redness and visual disturbance.  Respiratory:  Positive for cough. Negative for chest tightness, shortness of breath and wheezing.   Cardiovascular:  Negative for chest pain and palpitations.  Gastrointestinal:  Negative for abdominal pain, constipation, diarrhea, nausea and vomiting.  Genitourinary:  Negative for dysuria, frequency and urgency.  Musculoskeletal:  Negative for myalgias.  Neurological:  Negative for dizziness, weakness and headaches.  Psychiatric/Behavioral:  Negative for confusion.   All other systems reviewed and are negative.   Physical Exam Triage Vital Signs ED  Triage Vitals  Enc Vitals Group     BP 07/06/21 1931 (!) 156/97     Pulse Rate 07/06/21 1931 (!) 111     Resp 07/06/21 1931 20     Temp 07/06/21 1931 98.6 F (37 C)     Temp src --      SpO2 07/06/21 1931 99 %     Weight --      Height --      Head Circumference --      Peak Flow --      Pain Score 07/06/21 1934 8     Pain Loc --      Pain Edu? --      Excl. in [place]? --    No data found.  Updated Vital Signs BP (!) 156/97 (BP Location: Right Arm)   Pulse (!) 111   Temp 98.6 F (37 C)   Resp 20   LMP 07/04/2021   SpO2 99%   Visual Acuity Right Eye Distance:   Left Eye Distance:   Bilateral Distance:    Right Eye Near:   Left Eye Near:    Bilateral Near:     Physical Exam Vitals reviewed.  Constitutional:      General: She is not in acute distress.    Appearance: Normal appearance. She is not ill-appearing.  HENT:     Head: Normocephalic and atraumatic.     Right Ear: Tympanic membrane, ear canal and external ear normal. No tenderness. No middle ear effusion. There is no impacted cerumen. Tympanic membrane is not perforated, erythematous, retracted or bulging.     Left Ear: Tympanic membrane, ear canal and external ear normal. No tenderness.  No middle ear effusion. There is no impacted cerumen. Tympanic membrane is not perforated, erythematous, retracted or bulging.     Nose: Nose normal. No congestion.     Mouth/Throat:     Mouth: Mucous membranes are moist.     Pharynx: Uvula midline. No oropharyngeal exudate or posterior oropharyngeal erythema.  Eyes:     Extraocular Movements: Extraocular movements intact.     Pupils: Pupils are equal, round, and reactive to light.  Cardiovascular:     Rate and Rhythm: Normal rate and regular rhythm.     Heart sounds: Normal heart sounds.  Pulmonary:     Effort: Pulmonary effort is normal.     Breath sounds: Normal breath sounds. No decreased breath sounds, wheezing, rhonchi or rales.  Abdominal:     Palpations: Abdomen  is soft.     Tenderness: There is no abdominal tenderness. There is no guarding or rebound.  Neurological:     General: No focal deficit present.     Mental Status: She is alert and oriented to person, place, and time.  Psychiatric:        Mood and Affect: Mood normal.  Behavior: Behavior normal.        Thought Content: Thought content normal.        Judgment: Judgment normal.     UC Treatments / Results  Labs (all labs ordered are listed, but only abnormal results are displayed) Labs Reviewed  SARS CORONAVIRUS 2 (TAT 6-24 HRS)    EKG   Radiology No results found.  Procedures Procedures (including critical care time)  Medications Ordered in UC Medications - No data to display  Initial Impression / Assessment and Plan / UC Course  I have reviewed the triage vital signs and the nursing notes.  Pertinent labs & imaging results that were available during my care of the patient were reviewed by me and considered in my medical decision making (see chart for details).     This patient is a very pleasant 45 y.o. year old female presenting with covid-19.Today this pt is afebrile nontachycardic nontachypneic, oxygenating well on room air, no wheezes rhonchi or rales. Denies cardiopulmonary ds hx.   Positive home covid test. Requesting confirmation by PCR. Fully vaccinated for covid-19. Currently on her period. Promethazine DM.   ED return precautions discussed. Patient verbalizes understanding and agreement.   Coding level 4 for acute illness with systemic symptoms and prescription drug management.  Final Clinical Impressions(s) / UC Diagnoses   Final diagnoses:  COVID-19     Discharge Instructions      -Promethazine DM cough syrup for congestion/cough. This could make you drowsy, so take at night before bed. -For fevers/chills, bodyaches, headaches- Take Tylenol 1000 mg 3 times daily, and ibuprofen 800 mg 3 times daily with food.  You can take these together,  or alternate every 3-4 hours. -With a virus, you're typically contagious for 5-7 days, or as long as you're having fevers.       ED Prescriptions     Medication Sig Dispense Auth. Provider   promethazine-dextromethorphan (PROMETHAZINE-DM) 6.25-15 MG/5ML syrup Take 5 mLs by mouth 4 (four) times daily as needed for cough. 118 mL Hazel Sams, PA-C      PDMP not reviewed this encounter.   Hazel Sams, PA-C 07/06/21 2010

## 2021-07-07 LAB — SARS CORONAVIRUS 2 (TAT 6-24 HRS): SARS Coronavirus 2: POSITIVE — AB

## 2021-07-09 ENCOUNTER — Other Ambulatory Visit (HOSPITAL_COMMUNITY): Payer: Self-pay

## 2021-07-17 ENCOUNTER — Other Ambulatory Visit (HOSPITAL_COMMUNITY): Payer: Self-pay

## 2021-07-19 ENCOUNTER — Other Ambulatory Visit (HOSPITAL_COMMUNITY): Payer: Self-pay

## 2021-07-19 MED ORDER — ONETOUCH VERIO VI STRP
ORAL_STRIP | 2 refills | Status: DC
  Filled 2021-07-19: qty 100, 30d supply, fill #0

## 2021-07-19 MED ORDER — ONETOUCH ULTRASOFT LANCETS MISC: 2 refills | Status: DC

## 2021-07-19 MED ORDER — LABETALOL HCL 100 MG PO TABS
100.0000 mg | ORAL_TABLET | Freq: Two times a day (BID) | ORAL | 1 refills | Status: DC
  Filled 2021-07-19: qty 20, 10d supply, fill #0
  Filled 2021-07-19: qty 40, 20d supply, fill #0
  Filled 2021-09-07: qty 60, 30d supply, fill #1

## 2021-07-20 ENCOUNTER — Other Ambulatory Visit (HOSPITAL_COMMUNITY): Payer: Self-pay

## 2021-07-20 MED ORDER — FREESTYLE LITE W/DEVICE KIT
PACK | 0 refills | Status: AC
  Filled 2021-07-20: qty 1, 1d supply, fill #0

## 2021-07-20 MED ORDER — FREESTYLE LITE TEST VI STRP
ORAL_STRIP | 2 refills | Status: AC
  Filled 2021-07-20: qty 50, 50d supply, fill #0
  Filled 2021-11-12: qty 50, 50d supply, fill #1
  Filled 2022-06-27: qty 50, 50d supply, fill #2

## 2021-07-20 MED ORDER — FREESTYLE LANCETS MISC
2 refills | Status: AC
  Filled 2021-07-20: qty 100, 90d supply, fill #0

## 2021-07-27 ENCOUNTER — Other Ambulatory Visit (HOSPITAL_COMMUNITY): Payer: Self-pay

## 2021-07-27 MED FILL — Propylthiouracil Tab 50 MG: ORAL | 90 days supply | Qty: 180 | Fill #0 | Status: AC

## 2021-08-23 ENCOUNTER — Other Ambulatory Visit: Payer: Self-pay | Admitting: Physician Assistant

## 2021-08-23 DIAGNOSIS — R519 Headache, unspecified: Secondary | ICD-10-CM

## 2021-08-29 ENCOUNTER — Ambulatory Visit
Admission: RE | Admit: 2021-08-29 | Discharge: 2021-08-29 | Disposition: A | Discharge status: Home / Self Care | Payer: No Typology Code available for payment source | Source: Ambulatory Visit | Attending: Physician Assistant | Admitting: Physician Assistant

## 2021-08-29 ENCOUNTER — Other Ambulatory Visit: Payer: Self-pay

## 2021-08-29 DIAGNOSIS — R519 Headache, unspecified: Secondary | ICD-10-CM

## 2021-08-29 MED ORDER — GADOBENATE DIMEGLUMINE 529 MG/ML IV SOLN
20.0000 mL | Freq: Once | INTRAVENOUS | Status: AC | PRN
  Administered 2021-08-29: 20 mL via INTRAVENOUS

## 2021-09-07 ENCOUNTER — Other Ambulatory Visit (HOSPITAL_COMMUNITY): Payer: Self-pay

## 2021-09-25 ENCOUNTER — Other Ambulatory Visit (HOSPITAL_COMMUNITY): Payer: Self-pay

## 2021-09-25 MED ORDER — METFORMIN HCL ER 500 MG PO TB24
500.0000 mg | ORAL_TABLET | Freq: Two times a day (BID) | ORAL | 0 refills | Status: DC
  Filled 2021-09-25 – 2021-11-12 (×2): qty 180, 90d supply, fill #0

## 2021-09-25 MED ORDER — LABETALOL HCL 100 MG PO TABS
100.0000 mg | ORAL_TABLET | Freq: Two times a day (BID) | ORAL | 0 refills | Status: DC
  Filled 2021-09-25 – 2021-10-12 (×2): qty 180, 90d supply, fill #0

## 2021-10-04 ENCOUNTER — Other Ambulatory Visit (HOSPITAL_COMMUNITY): Payer: Self-pay

## 2021-10-12 ENCOUNTER — Other Ambulatory Visit (HOSPITAL_COMMUNITY): Payer: Self-pay

## 2021-11-09 ENCOUNTER — Other Ambulatory Visit (HOSPITAL_COMMUNITY): Payer: Self-pay

## 2021-11-09 MED ORDER — WEGOVY 0.5 MG/0.5ML ~~LOC~~ SOAJ: 0.5000 mL | SUBCUTANEOUS | 0 refills | Status: DC

## 2021-11-09 MED ORDER — SULFAMETHOXAZOLE-TRIMETHOPRIM 800-160 MG PO TABS
1.0000 | ORAL_TABLET | Freq: Two times a day (BID) | ORAL | 0 refills | Status: AC
  Filled 2021-11-09: qty 10, 5d supply, fill #0

## 2021-11-09 MED ORDER — WEGOVY 0.25 MG/0.5ML ~~LOC~~ SOAJ: 0.5000 mL | SUBCUTANEOUS | 0 refills | Status: DC

## 2021-11-12 ENCOUNTER — Other Ambulatory Visit (HOSPITAL_COMMUNITY): Payer: Self-pay

## 2021-11-28 ENCOUNTER — Other Ambulatory Visit (HOSPITAL_COMMUNITY): Payer: Self-pay

## 2021-12-04 ENCOUNTER — Other Ambulatory Visit (HOSPITAL_COMMUNITY): Payer: Self-pay

## 2021-12-04 MED ORDER — FLUCONAZOLE 150 MG PO TABS
150.0000 mg | ORAL_TABLET | Freq: Every day | ORAL | 0 refills | Status: DC
  Filled 2021-12-04: qty 1, 1d supply, fill #0

## 2021-12-12 ENCOUNTER — Other Ambulatory Visit (HOSPITAL_COMMUNITY): Payer: Self-pay

## 2021-12-12 MED ORDER — NYSTATIN-TRIAMCINOLONE 100000-0.1 UNIT/GM-% EX CREA
TOPICAL_CREAM | Freq: Two times a day (BID) | CUTANEOUS | 1 refills | Status: DC
  Filled 2021-12-12: qty 30, 15d supply, fill #0

## 2021-12-14 ENCOUNTER — Other Ambulatory Visit (HOSPITAL_COMMUNITY): Payer: Self-pay

## 2021-12-24 ENCOUNTER — Encounter: Payer: Self-pay | Admitting: Gastroenterology

## 2022-01-11 ENCOUNTER — Encounter: Payer: Self-pay | Admitting: Gastroenterology

## 2022-01-11 ENCOUNTER — Ambulatory Visit (INDEPENDENT_AMBULATORY_CARE_PROVIDER_SITE_OTHER): Payer: No Typology Code available for payment source | Admitting: Gastroenterology

## 2022-01-11 ENCOUNTER — Other Ambulatory Visit (HOSPITAL_COMMUNITY): Payer: Self-pay

## 2022-01-11 ENCOUNTER — Other Ambulatory Visit (INDEPENDENT_AMBULATORY_CARE_PROVIDER_SITE_OTHER): Payer: No Typology Code available for payment source

## 2022-01-11 VITALS — BP 134/70 | HR 88 | Ht 64.0 in | Wt 221.4 lb

## 2022-01-11 DIAGNOSIS — K581 Irritable bowel syndrome with constipation: Secondary | ICD-10-CM | POA: Diagnosis not present

## 2022-01-11 DIAGNOSIS — K219 Gastro-esophageal reflux disease without esophagitis: Secondary | ICD-10-CM | POA: Diagnosis not present

## 2022-01-11 DIAGNOSIS — R748 Abnormal levels of other serum enzymes: Secondary | ICD-10-CM

## 2022-01-11 LAB — COMPREHENSIVE METABOLIC PANEL
ALT: 39 U/L — ABNORMAL HIGH (ref 0–35)
AST: 19 U/L (ref 0–37)
Albumin: 4.4 g/dL (ref 3.5–5.2)
Alkaline Phosphatase: 77 U/L (ref 39–117)
BUN: 14 mg/dL (ref 6–23)
CO2: 29 mEq/L (ref 19–32)
Calcium: 9.7 mg/dL (ref 8.4–10.5)
Chloride: 103 mEq/L (ref 96–112)
Creatinine, Ser: 1.05 mg/dL (ref 0.40–1.20)
GFR: 64.66 mL/min (ref >60.00)
Glucose, Bld: 93 mg/dL (ref 70–99)
Potassium: 4.3 mEq/L (ref 3.5–5.1)
Sodium: 136 mEq/L (ref 135–145)
Total Bilirubin: 0.3 mg/dL (ref 0.2–1.2)
Total Protein: 8 g/dL (ref 6.0–8.3)

## 2022-01-11 MED ORDER — OMEPRAZOLE 20 MG PO CPDR
20.0000 mg | DELAYED_RELEASE_CAPSULE | Freq: Every day | ORAL | 1 refills | Status: DC
  Filled 2022-01-11: qty 60, 60d supply, fill #0

## 2022-01-11 NOTE — Progress Notes (Signed)
HPI : Beth King is a pleasant 46 year old female with obesity, symptomatic fibroids and migraines who is referred to [place] by Dr. Carlynn Purl for further evaluation of multiple chronic GI symptoms.  She reports chronic symptoms of abdominal pain, bloating, nausea, excessive belching/flatulence and constipation as well as diarrhea.  She says she has had problems with her GI tract for many years but her symptoms have been worse even since her myomectomy in June, and the last two months have been 'crazy'.   She has symptoms on a daily basis, rarely does she not have problems.  She feels bloated and nauseated most of the time.  She has small, unsatisfactory stools with straining.  She will have periods of constipation alternating with diarrhea, but she says most of the time she is constipated.  She estimates having constipation 75% of the time and diarrhea 25%.   She has heartburn 2-3 days a week.  No vomiting.  No dysphagia.  She takes ginger root for nausea and takes Prune juice for constipation.   She recently stated taking labetalol and increased her metformin to 2 tabs daily.  She will be starting [facility] for weight loss this weekend.  She denies any changes to her diet that would account for worsening of her symptoms.  She does state that spicy food and citrus-based foods make a large portion of her diet.   Past Medical History:  Diagnosis Date   Allergic rhinitis    Cardiomegaly 2007   incidental finding, no tx required per pt.   Diabetes mellitus without complication ([place])    Enlarged uterus    Fibroid    Hyperthyroidism    Migraine    Plantar fasciitis    Pre-diabetes    Seasonal allergies      Past Surgical History:  Procedure Laterality Date   MYOMECTOMY N/A 04/24/2021   Procedure: ABDOMINAL MYOMECTOMY;  Surgeon: Sherlyn Hay, DO;  Location: [place];  Service: Gynecology;  Laterality: N/A;   Family History  Problem Relation Age of Onset   Diabetes Mother     Hypertension Mother    Arthritis Father    Thyroid disease Neg Hx    Social History   Tobacco Use   Smoking status: Never   Smokeless tobacco: Never  Vaping Use   Vaping Use: Never used  Substance Use Topics   Alcohol use: Yes    Comment: Occassionally  wine   Drug use: No   Current Outpatient Medications  Medication Sig Dispense Refill   acetaminophen (TYLENOL) 500 MG tablet Take 1,000 mg by mouth as needed.     Blood Glucose Monitoring Suppl (FREESTYLE LITE) w/Device KIT Use as directed 1 kit 0   folic acid (FOLVITE) 239 MCG tablet Take 800 mcg by mouth daily.     glucose blood (FREESTYLE LITE) test strip Test once daily as directed 100 each 2   glucose blood (ONETOUCH VERIO) test strip Use as directed 100 each 2   labetalol (NORMODYNE) 100 MG tablet Take 1 tablet (100 mg total) by mouth 2 (two) times daily. 180 tablet 0   Lancets (FREESTYLE) lancets Test once daily as directed 100 each 2   Lancets (ONETOUCH ULTRASOFT) lancets Use as directed 100 each 2   levocetirizine (XYZAL) 5 MG tablet Take 1 tablet (5 mg total) by mouth daily as needed. (Patient taking differently: Take 5 mg by mouth as needed.) 90 tablet 0   metFORMIN (GLUCOPHAGE-XR) 500 MG 24 hr tablet Take 1 tablet (  500 mg total) by mouth 2 (two) times daily with meals. 180 tablet 0   propylthiouracil (PTU) 50 MG tablet TAKE 2 TABLETS (100 MG TOTAL) BY MOUTH DAILY. (Patient taking differently: as needed.) 180 tablet 3   simethicone (GAS-X) 80 MG chewable tablet Chew 1 tablet (80 mg total) by mouth every 6 (six) hours as needed for flatulence. (Patient taking differently: Chew 80 mg by mouth as needed for flatulence.) 30 tablet 0   Semaglutide-Weight Management (WEGOVY) 0.25 MG/0.5ML SOAJ Inject 0.25 mg into the skin once a week. (Patient not taking: Reported on 01/11/2022) 2 mL 0   Semaglutide-Weight Management (WEGOVY) 0.5 MG/0.5ML SOAJ Inject 0.5 mg into the skin once a week. Do not fill before 12/05/21. (Patient not taking:  Reported on 01/11/2022) 2 mL 0   No current facility-administered medications for this visit.   Allergies  Allergen Reactions   Chloroquine Phosphate [Chloroquine] Itching    "itching all over my body for almost like a week"     Review of Systems: All systems reviewed and negative except where noted in HPI.    No results found.  Physical Exam: BP 134/70 (BP Location: Left Arm, Patient Position: Sitting, Cuff Size: Normal)    Pulse 88    Ht $R'5\' 4"'hE$  (1.626 m)    Wt 221 lb 6 oz (100.4 kg)    LMP 01/06/2022    BMI 38.00 kg/m  Constitutional: Pleasant,well-developed, African female in no acute distress. HEENT: Normocephalic and atraumatic. Conjunctivae are normal. No scleral icterus. Cardiovascular: Normal rate, regular rhythm.  Pulmonary/chest: Effort normal and breath sounds normal. No wheezing, rales or rhonchi. Abdominal: Soft, nondistended, nontender. Bowel sounds active throughout. There are no masses palpable. No hepatomegaly. Extremities: no edema Neurological: Alert and oriented to person place and time. Skin: Skin is warm and dry. No rashes noted. Psychiatric: Normal mood and affect. Behavior is normal.  CBC    Component Value Date/Time   WBC 11.3 (H) 04/25/2021 0224   RBC 4.13 04/25/2021 0224   HGB 12.3 04/25/2021 0224   HCT 37.4 04/25/2021 0224   PLT 311 04/25/2021 0224   MCV 90.6 04/25/2021 0224   MCH 29.8 04/25/2021 0224   MCHC 32.9 04/25/2021 0224   RDW 14.3 04/25/2021 0224   LYMPHSABS 2,275 10/17/2016 0953   MONOABS 520 10/17/2016 0953   EOSABS 390 10/17/2016 0953   BASOSABS 0 10/17/2016 0953    CMP     Component Value Date/Time   NA 137 10/17/2016 0953   K 4.0 10/17/2016 0953   CL 102 10/17/2016 0953   CO2 22 10/17/2016 0953   GLUCOSE 105 (H) 10/17/2016 0953   BUN 8 10/17/2016 0953   CREATININE 0.54 10/17/2016 0953   CALCIUM 9.4 10/17/2016 0953   PROT 6.8 10/17/2016 0953   ALBUMIN 3.9 10/17/2016 0953   AST 22 10/17/2016 0953   ALT 62 (H)  10/17/2016 0953   ALKPHOS 158 (H) 10/17/2016 0953   BILITOT 0.3 10/17/2016 0953   GFRNONAA >89 10/17/2016 0953   GFRAA >89 10/17/2016 0953     ASSESSMENT AND PLAN: 46 year old female with chronic symptoms of constipation, bloating and abdominal pain, as well as symptoms of heartburn and nausea.  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  We discussed the proposed pathophysiology of IBS and gut brain axis disorders in general.  We discussed management of IBS, to include use of medications to improve bowel habits, as needed pain medicine, centrally acting neuromodulators, role of empiric dietary modifications to include  a low FODMAP diet gluten-free diet, as well as the role of cognitive therapies.  We discussed the goals of IBS management, namely to minimize the impact of GI symptoms on quality of life.  I emphasized that it would be unreasonable for the patient to expect completely normal bowel habits and no abdominal discomfort. The patient preferred natural remedies, and so I recommended fiber supplementation and regular consumption of prunes and kiwifruit to keep her regular, and to use IB-Gard to help with abdominal pain.  Will check TTG/IgA to exclude celiac disease.  We discussed the pathophysiology of GERD and the principles of GERD management to include lifestyle modifications  such as dietary discretion (avoidance of alcohol, tobacco, caffeinated and carbonated beverages, spicy/greasy foods, citrus, peppermint/chocolate), weight loss if applicable, head of bed elevation andconsuming last meal of day within 3 hours of bedtime; pharmacologic options to include PPIs, H2RAs and OTC antacids; and finally surgical or endoscopic fundoplication.  Given her rather frequent symptoms, I recommended starting omeprazole once daily.  Will check H. Pylori stool antigen given her prominent nausea/bloating symptoms.  I told her if she is able to lose weight, her GERD symptoms are likely to improve.  Patient's  liver enzymes were elevated when last checked in 2017.  Will check CMP again.  If still elevated, will evaluate further.  IBS-C - Metamucil daily - Prunes, kiwifruit - TTG/IgA  GERD - Prilosec 20 mg PO daily - H pylori stool antigen  Elevated liver enzymes - Repeat CMP  Colon cancer screening - Patient will be due for screening colonoscopy in September of this year.  Miciah Shealy E. Candis Schatz, MD [facility]  CC:  Sherlyn Hay, *

## 2022-01-11 NOTE — Patient Instructions (Addendum)
If you are age 45 or older, your body mass index should be between 23-30. Your Body mass index is 38 kg/m. If this is out of the aforementioned range listed, please consider follow up with your Primary Care Provider.  If you are age 80 or younger, your body mass index should be between 19-25. Your Body mass index is 38 kg/m. If this is out of the aformentioned range listed, please consider follow up with your Primary Care Provider.   Your provider has requested that you go to the basement level for lab work before leaving today. Press "B" on the elevator. The lab is located at the first door on the left as you exit the elevator.  Please purchase Metamucil over the counter. Take daily. Increase as needed to 2-3 times daily.  We have sent the following medications to your pharmacy for you to pick up at your convenience: Prilosec 20 mg daily.  Due to recent changes in healthcare laws, you may see the results of your imaging and laboratory studies on MyChart before your provider has had a chance to review them.  We understand that in some cases there may be results that are confusing or concerning to you. Not all laboratory results come back in the same time frame and the provider may be waiting for multiple results in order to interpret others.  Please give [place] 48 hours in order for your provider to thoroughly review all the results before contacting the office for clarification of your results.   The [facility] providers would like to encourage you to use [employer] to communicate with providers for non-urgent requests or questions.  Due to long hold times on the telephone, sending your provider a message by [facility] may be a faster and more efficient way to get a response.  Please allow 48 business hours for a response.  Please remember that this is for non-urgent requests.   It was a pleasure to see you today!  Thank you for trusting me with your gastrointestinal care!    Scott E.Candis Schatz, MD

## 2022-01-13 LAB — H. PYLORI ANTIGEN, STOOL: H pylori Ag, Stl: POSITIVE — AB

## 2022-01-13 NOTE — Progress Notes (Signed)
[name],  The stool test was positive for H. Pylori.  This could be a cause of some of your symptoms of nausea and bloating.  We will plan on treating with Pylera as below. Please confirm no medication allergies to the prescribed regimen.   1) Pylera (bismuth subcitrate/metronidazole/tetracycline) 3 caps PO QID x 10 days 2) Omeprazole 20 mg PO BID x 10 days  4 weeks after treatment completed, check H. Pylori stool antigen to confirm eradication (must be off omeprazole for 2 weeks prior to specimen submission)  If Pylera is cost prohibitive, will treat with quadruple therapy.  [name], can you please order these medications and the stool test for Beth King?

## 2022-01-14 ENCOUNTER — Other Ambulatory Visit (HOSPITAL_COMMUNITY): Payer: Self-pay

## 2022-01-14 ENCOUNTER — Telehealth: Payer: Self-pay

## 2022-01-14 ENCOUNTER — Other Ambulatory Visit: Payer: Self-pay

## 2022-01-14 DIAGNOSIS — A048 Other specified bacterial intestinal infections: Secondary | ICD-10-CM

## 2022-01-14 LAB — TISSUE TRANSGLUTAMINASE, IGA: (tTG) Ab, IgA: <1 U/mL

## 2022-01-14 LAB — IGA: Immunoglobulin A: 175 mg/dL (ref 47–310)

## 2022-01-14 MED ORDER — BISMUTH 262 MG PO CHEW
524.0000 mg | CHEWABLE_TABLET | Freq: Four times a day (QID) | ORAL | 0 refills | Status: DC
  Filled 2022-01-14: qty 112, fill #0

## 2022-01-14 MED ORDER — TETRACYCLINE HCL 500 MG PO CAPS
500.0000 mg | ORAL_CAPSULE | Freq: Four times a day (QID) | ORAL | 0 refills | Status: DC
  Filled 2022-01-14: qty 40, 10d supply, fill #0

## 2022-01-14 MED ORDER — AMOXICILLIN 500 MG PO CAPS
500.0000 mg | ORAL_CAPSULE | Freq: Four times a day (QID) | ORAL | 0 refills | Status: DC
  Filled 2022-01-14: qty 40, 10d supply, fill #0

## 2022-01-14 MED ORDER — METRONIDAZOLE 250 MG PO TABS
250.0000 mg | ORAL_TABLET | Freq: Four times a day (QID) | ORAL | 0 refills | Status: DC
  Filled 2022-01-14: qty 40, 10d supply, fill #0

## 2022-01-14 MED ORDER — OMEPRAZOLE 20 MG PO CPDR
20.0000 mg | DELAYED_RELEASE_CAPSULE | Freq: Two times a day (BID) | ORAL | 0 refills | Status: AC
  Filled 2022-01-14: qty 20, 10d supply, fill #0

## 2022-01-14 NOTE — Telephone Encounter (Signed)
Pharmacy called and tetracycline is not covered. Requesting script for amoxicillin be sent in for the pt. Script sent to the pharmacy. Dr. Candis Schatz aware.

## 2022-01-15 ENCOUNTER — Other Ambulatory Visit (HOSPITAL_COMMUNITY): Payer: Self-pay

## 2022-01-15 MED ORDER — LABETALOL HCL 100 MG PO TABS
100.0000 mg | ORAL_TABLET | Freq: Two times a day (BID) | ORAL | 1 refills | Status: AC
Start: 2022-01-15 — End: ?
  Filled 2022-01-15: qty 180, 90d supply, fill #0
  Filled 2022-09-10: qty 180, 90d supply, fill #1

## 2022-01-15 MED ORDER — METFORMIN HCL ER 500 MG PO TB24
500.0000 mg | ORAL_TABLET | Freq: Two times a day (BID) | ORAL | 1 refills | Status: DC
  Filled 2022-01-15: qty 180, 90d supply, fill #0

## 2022-01-16 ENCOUNTER — Other Ambulatory Visit (HOSPITAL_COMMUNITY): Payer: Self-pay

## 2022-01-16 IMAGING — MR MR HEAD WO/W CM
13 series · 48 of 48 positions shown · IV contrast (multihance)
Comparison: None.

CLINICAL DATA: Headaches and dizziness over the last several
months.

EXAM:
MRI HEAD WITHOUT AND WITH CONTRAST
TECHNIQUE: Multiplanar, multiecho pulse sequences of the brain and surrounding
structures were obtained without and with intravenous contrast.
CONTRAST:  20mL MULTIHANCE GADOBENATE DIMEGLUMINE 529 MG/ML IV SOLN

[Series 2: T1 · sagittal · 5.0mm · 0.45mm/px · 2 of 25 slices shown]
[im 1/25]
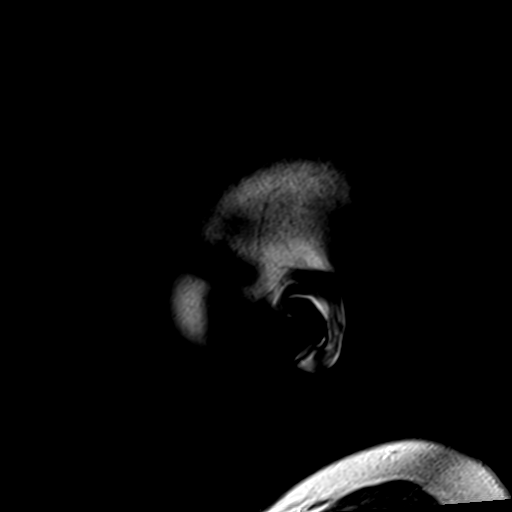
[im 25/25]
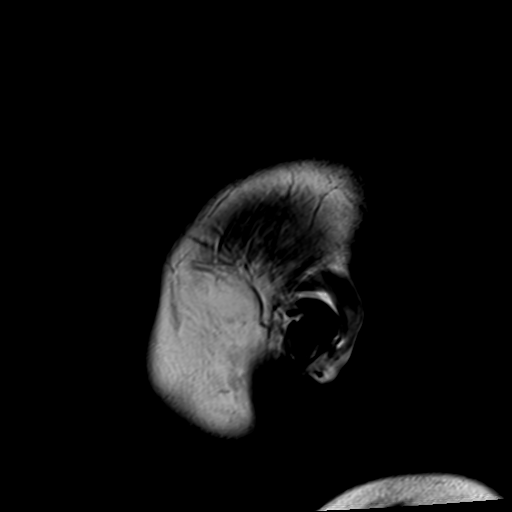

[Series 3: ax ep2d_diff_3 · axial · 3.0mm · 1.80mm/px · z∈[-71,+91]mm · 6 of 109 slices shown]
[im 1/109]
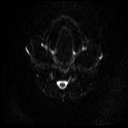
[im 22/109]
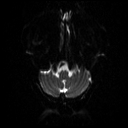
[im 44/109]
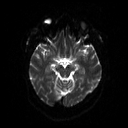
[im 65/109]
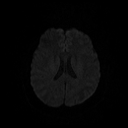
[im 87/109]
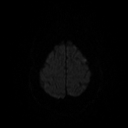
[im 109/109]
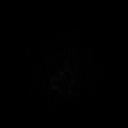

[Series 4: ax ep2d_diff_3_adc · axial · 3.0mm · 1.80mm/px · z∈[-71,+91]mm · 3 of 55 slices shown]
[im 1/55]
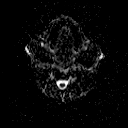
[im 28/55]
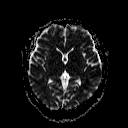
[im 55/55]
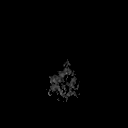

[Series 5: cor ep2d_diff · coronal · 5.0mm · 1.77mm/px · 3 of 58 slices shown]
[im 1/58]
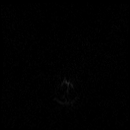
[im 29/58]
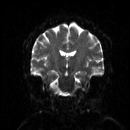
[im 58/58]
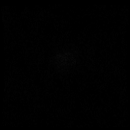

[Series 6: cor ep2d_diff_adc · coronal · 5.0mm · 1.77mm/px · 2 of 30 slices shown]
[im 1/30]
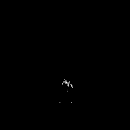
[im 30/30]
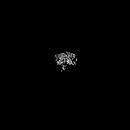

[Series 8: swi_images · axial · 2.0mm · 0.98mm/px · z∈[-70,+88]mm · 5 of 80 slices shown]
[im 1/80]
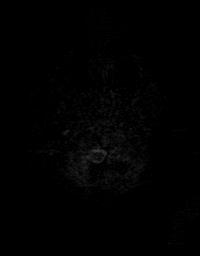
[im 20/80]
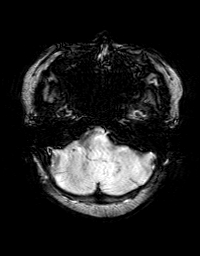
[im 40/80]
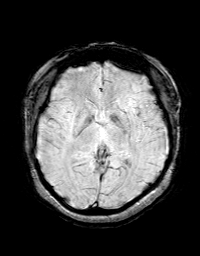
[im 60/80]
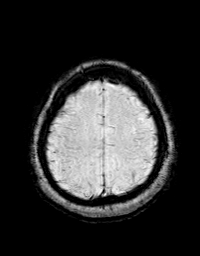
[im 80/80]
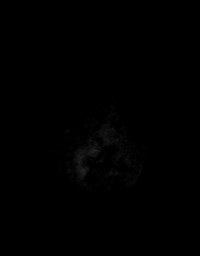

[Series 9: FLAIR · axial · 3.0mm · 0.43mm/px · z∈[-66,+86]mm · 2 of 40 slices shown (1 of 2)]
[im 1/40]
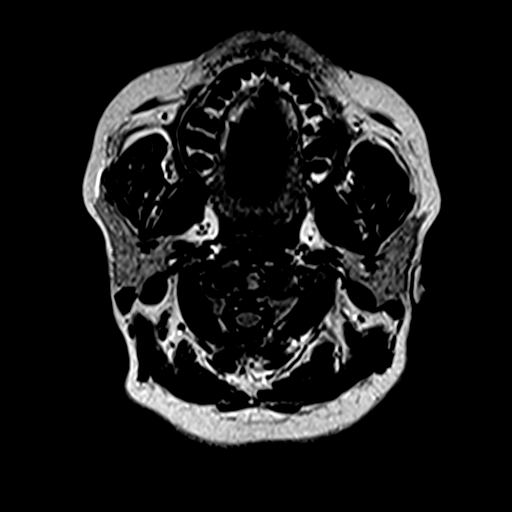
[im 40/40]
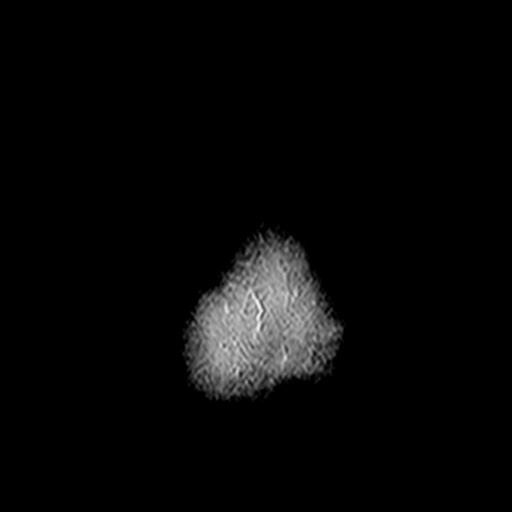

[Series 10: T2 · axial · 5.0mm · 0.65mm/px · z∈[-75,+93]mm · 2 of 29 slices shown (1 of 2)]
[im 1/29]
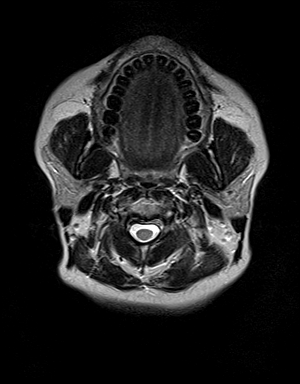
[im 29/29]
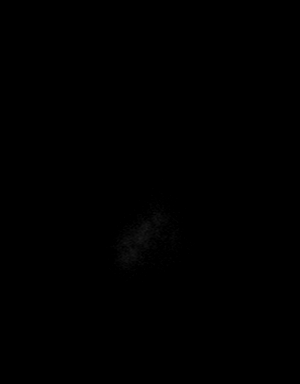

[Series 11: t1_mpr_tra · axial · 1.0mm · 0.72mm/px · z∈[-70,+89]mm · 9 of 160 slices shown]
[im 1/160]
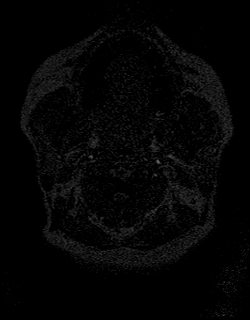
[im 20/160]
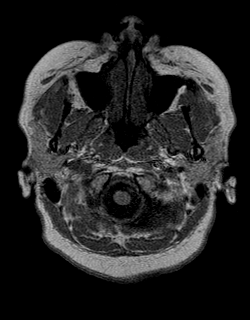
[im 40/160]
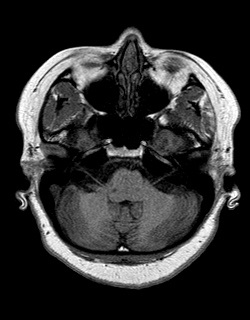
[im 60/160]
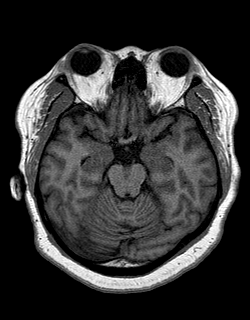
[im 80/160]
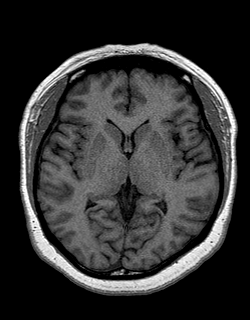
[im 100/160]
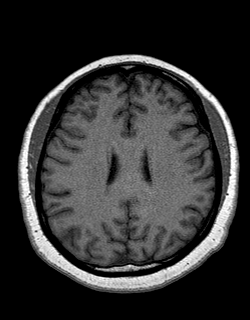
[im 120/160]
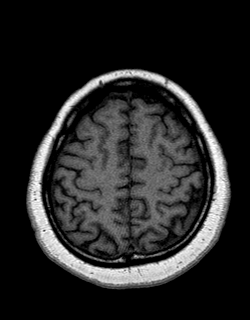
[im 140/160]
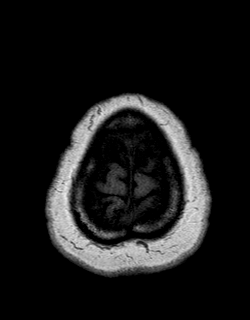
[im 160/160]
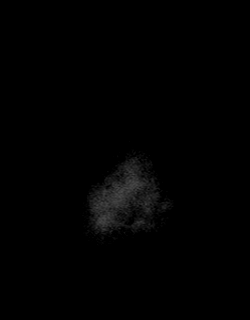

[Series 12: FLAIR · sagittal · 3.0mm · 0.43mm/px · 2 of 40 slices shown (2 of 2)]
[im 1/40]
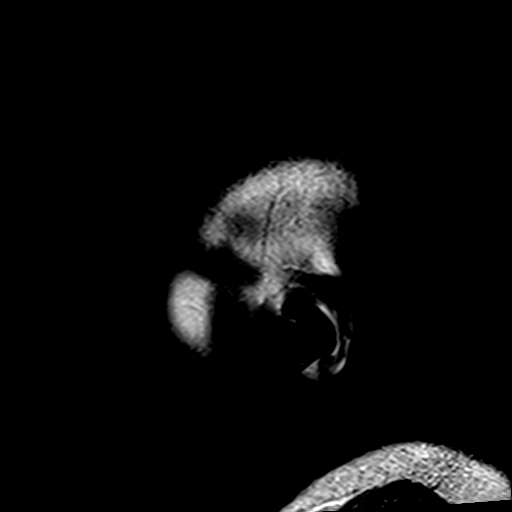
[im 40/40]
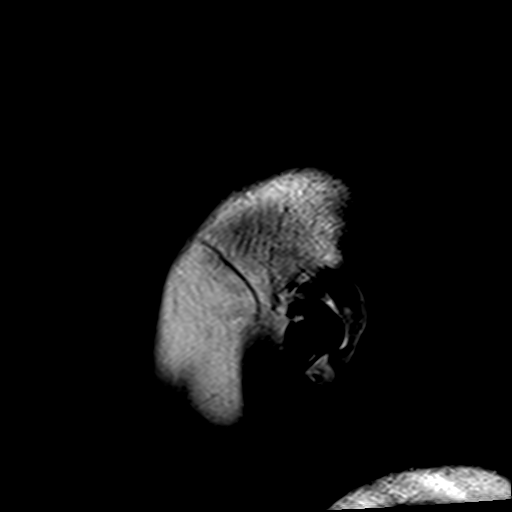

[Series 13: T2 · coronal · 5.0mm · 0.43mm/px · 2 of 28 slices shown (2 of 2)]
[im 1/28]
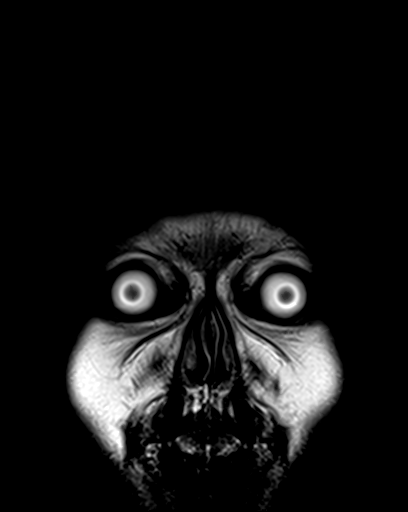
[im 28/28]
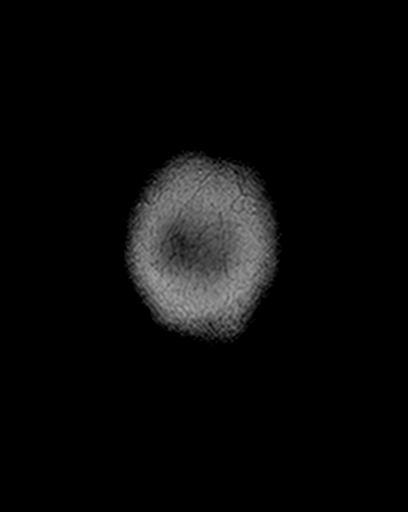

[Series 14: post t1_mpr_tra · axial · 1.0mm · 0.72mm/px · z∈[-70,+89]mm · 9 of 160 slices shown]
[im 1/160]
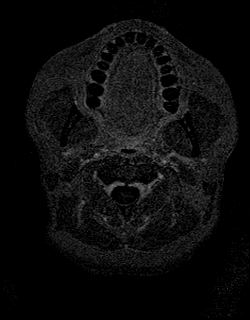
[im 20/160]
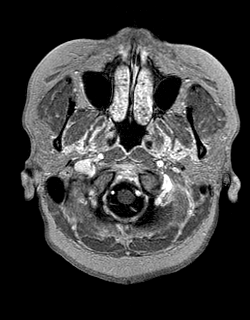
[im 40/160]
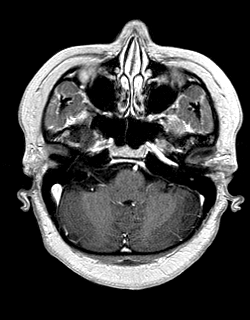
[im 60/160]
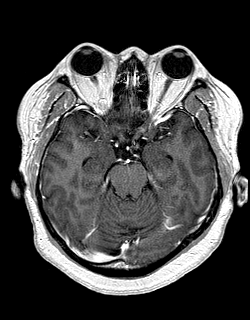
[im 80/160]
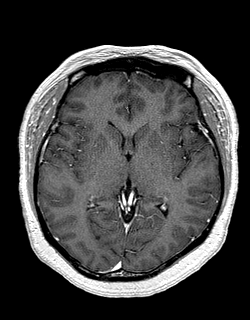
[im 100/160]
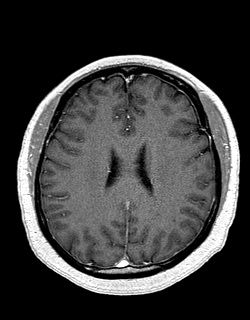
[im 120/160]
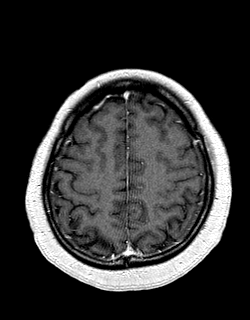
[im 140/160]
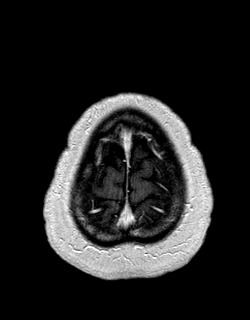
[im 160/160]
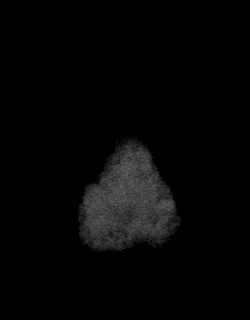

[Series 15: T1 post-contrast · coronal · 5.0mm · 0.72mm/px · 1 of 26 slices shown]
[im 1/26]
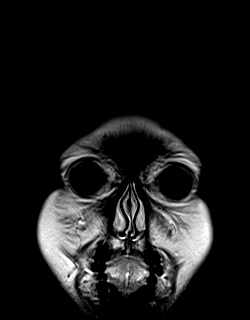

[48 of 48 positions shown; findings below may reference images not displayed]

FINDINGS: Brain: The brain has a normal appearance without evidence of
malformation, atrophy, old or acute small or large vessel
infarction, mass lesion, hemorrhage, hydrocephalus or extra-axial
collection. No sign of demyelinating disease. After contrast
administration, no abnormal enhancement occurs.

Vascular: Major vessels at the base of the brain show flow. Venous
sinuses appear patent.

Skull and upper cervical spine: Normal.

Sinuses/Orbits: Clear/normal.

Other: None significant.
IMPRESSION: Normal examination. No abnormality seen to explain the clinical
presentation.

## 2022-01-17 ENCOUNTER — Other Ambulatory Visit (HOSPITAL_COMMUNITY): Payer: Self-pay

## 2022-01-18 ENCOUNTER — Other Ambulatory Visit (HOSPITAL_COMMUNITY): Payer: Self-pay

## 2022-01-21 ENCOUNTER — Other Ambulatory Visit (HOSPITAL_COMMUNITY): Payer: Self-pay

## 2022-01-22 ENCOUNTER — Other Ambulatory Visit (HOSPITAL_COMMUNITY): Payer: Self-pay

## 2022-01-23 ENCOUNTER — Other Ambulatory Visit (HOSPITAL_COMMUNITY): Payer: Self-pay

## 2022-01-31 ENCOUNTER — Other Ambulatory Visit (HOSPITAL_COMMUNITY): Payer: Self-pay

## 2022-02-12 ENCOUNTER — Telehealth: Payer: Self-pay

## 2022-02-12 NOTE — Telephone Encounter (Signed)
Spoke with pt and she stated that she does not like the thyroid medication that you prescribed her and she would rather do the methimazole. 03/06/2022 will have been a yr since pt has been seen.  ? ?Please Advise ? ?

## 2022-02-18 ENCOUNTER — Other Ambulatory Visit (HOSPITAL_COMMUNITY): Payer: Self-pay

## 2022-02-18 MED ORDER — WEGOVY 0.5 MG/0.5ML ~~LOC~~ SOAJ
0.5000 mg | SUBCUTANEOUS | 0 refills | Status: DC
  Filled 2022-02-18: qty 2, 28d supply, fill #0

## 2022-02-20 ENCOUNTER — Encounter: Payer: Self-pay | Admitting: Endocrinology

## 2022-02-20 ENCOUNTER — Ambulatory Visit: Payer: No Typology Code available for payment source | Admitting: Endocrinology

## 2022-02-20 ENCOUNTER — Other Ambulatory Visit: Payer: Self-pay

## 2022-02-20 VITALS — BP 122/90 | HR 102 | Ht 64.0 in | Wt 218.2 lb

## 2022-02-20 DIAGNOSIS — E0590 Thyrotoxicosis, unspecified without thyrotoxic crisis or storm: Secondary | ICD-10-CM

## 2022-02-20 LAB — TSH: TSH: 0.92 u[IU]/mL (ref 0.35–5.50)

## 2022-02-20 LAB — T4, FREE: Free T4: 0.69 ng/dL (ref 0.60–1.60)

## 2022-02-20 NOTE — Progress Notes (Signed)
? ?Subjective:  ? ? Patient ID: Beth King, female    DOB: 06/04/1976, 46 y.o.   MRN: [id number] ? ?HPI ?Pt returns for f/u of hyperthyroidism (dx'ed 2017; prob due to [place] dz, but she has never had thyroid imaging; she chose tapazole rx).  pt stopped tapazole 1-2 mos ago, due to pregnancy.  She suffered a miscarriage in early 2022.  She is at risk for another pregnancy.  She stopped PTU 3 weeks ago, due to bad taste. She has intermit palpitations.  ?Past Medical History:  ?Diagnosis Date  ? Allergic rhinitis   ? Cardiomegaly 2007  ? incidental finding, no tx required per pt.  ? Diabetes mellitus without complication ([place])   ? Enlarged uterus   ? Fibroid   ? Hyperthyroidism   ? Migraine   ? Plantar fasciitis   ? Pre-diabetes   ? Seasonal allergies   ? ? ?Past Surgical History:  ?Procedure Laterality Date  ? MYOMECTOMY N/A 04/24/2021  ? Procedure: ABDOMINAL MYOMECTOMY;  Surgeon: Sherlyn Hay, DO;  Location: [place];  Service: Gynecology;  Laterality: N/A;  ? ? ?Social History  ? ?Socioeconomic History  ? Marital status: Married  ?  Spouse name: Not on file  ? Number of children: 0  ? Years of education: Not on file  ? Highest education level: Associate degree: occupational, Hotel manager, or vocational program  ?Occupational History  ? Occupation: Retail banker  ?Tobacco Use  ? Smoking status: Never  ? Smokeless tobacco: Never  ?Vaping Use  ? Vaping Use: Never used  ?Substance and Sexual Activity  ? Alcohol use: Yes  ?  Comment: Occassionally  wine  ? Drug use: No  ? Sexual activity: Yes  ?Other Topics Concern  ? Not on file  ?Social History Narrative  ? Je[employer]ah's Witness  ? Lives at home with husband and mother    ? Right handed  ? Drinks occasional caffeine  ? ?Social Determinants of Health  ? ?Financial Resource Strain: Not on file  ?Food Insecurity: Not on file  ?Transportation Needs: Not on file  ?Physical Activity: Not on file  ?Stress: Not on file  ?Social Connections: Not on file  ?Intimate Partner  Violence: Not on file  ? ? ?Current Outpatient Medications on File Prior to Visit  ?Medication Sig Dispense Refill  ? acetaminophen (TYLENOL) 500 MG tablet Take 1,000 mg by mouth as needed.    ? amoxicillin (AMOXIL) 500 MG capsule Take 1 capsule (500 mg total) by mouth 4 (four) times daily. 40 capsule 0  ? Bismuth 262 MG CHEW Chew 524 mg by mouth 4 (four) times daily. 112 tablet 0  ? Blood Glucose Monitoring Suppl (FREESTYLE LITE) w/Device KIT Use as directed 1 kit 0  ? folic acid (FOLVITE) 093 MCG tablet Take 800 mcg by mouth daily.    ? glucose blood (FREESTYLE LITE) test strip Test once daily as directed 100 each 2  ? glucose blood (ONETOUCH VERIO) test strip Use as directed 100 each 2  ? labetalol (NORMODYNE) 100 MG tablet Take 1 tablet (100 mg total) by mouth 2 (two) times daily. 180 tablet 0  ? labetalol (NORMODYNE) 100 MG tablet Take 1 tablet (100 mg total) by mouth 2 (two) times daily. 180 tablet 1  ? Lancets (FREESTYLE) lancets Test once daily as directed 100 each 2  ? Lancets (ONETOUCH ULTRASOFT) lancets Use as directed 100 each 2  ? levocetirizine (XYZAL) 5 MG tablet Take 1 tablet (5 mg total) by mouth  daily as needed. (Patient taking differently: Take 5 mg by mouth as needed.) 90 tablet 0  ? metFORMIN (GLUCOPHAGE-XR) 500 MG 24 hr tablet Take 1 tablet (500 mg total) by mouth 2 (two) times daily with meals. 180 tablet 1  ? metroNIDAZOLE (FLAGYL) 250 MG tablet Take 1 tablet (250 mg total) by mouth 4 (four) times daily. 40 tablet 0  ? omeprazole (PRILOSEC) 20 MG capsule Take 1 capsule (20 mg total) by mouth daily. 60 capsule 1  ? omeprazole (PRILOSEC) 20 MG capsule Take 1 capsule (20 mg total) by mouth 2 (two) times daily before a meal. 20 capsule 0  ? Semaglutide-Weight Management (WEGOVY) 0.5 MG/0.5ML SOAJ Inject 0.5 mg into the skin once a week. Do not fill before 12/05/21. 2 mL 0  ? Semaglutide-Weight Management (WEGOVY) 0.5 MG/0.5ML SOAJ Inject 0.5 mg into the skin once a week. 2 mL 0  ? simethicone  (GAS-X) 80 MG chewable tablet Chew 1 tablet (80 mg total) by mouth every 6 (six) hours as needed for flatulence. (Patient taking differently: Chew 80 mg by mouth as needed for flatulence.) 30 tablet 0  ? ?No current facility-administered medications on file prior to visit.  ? ? ?Allergies  ?Allergen Reactions  ? Chloroquine Phosphate [Chloroquine] Itching  ?  "itching all over my body for almost like a week"  ? ? ?Family History  ?Problem Relation Age of Onset  ? Diabetes Mother   ? Hypertension Mother   ? Arthritis Father   ? Thyroid disease Neg Hx   ? ? ?BP 122/90 (BP Location: Left Arm, Patient Position: Sitting, Cuff Size: Normal)   Pulse (!) 102   Ht _0  (1.626 m)   Wt 218 lb 3.2 oz (99 kg)   SpO2 98%   BMI 37.45 kg/m?  ? ? ?Review of Systems ?Denies fever.   ?   ?Objective:  ? Physical Exam ?VITAL SIGNS:  See vs page ?GENERAL: no distress ?NECK: thyroid is slightly and diffusely enlarged.   ? ? ?Lab Results  ?Component Value Date  ? TSH 0.92 02/20/2022  ? ?   ?Assessment & Plan:  ?Hyperthyroidism: stable off rx, but she is at high risk for recurrence, as PTU is a very slow acting med. ?Please come back for a follow-up appointment in 3 months ? ?

## 2022-02-20 NOTE — Patient Instructions (Addendum)
Blood tests are requested for you today.  We'll let you know about the results.   ?Based on the results, we may need to resume the propylthiouracil.  If so, take it with a liquid that masks the taste.   ?If ever you have fever while taking this medication, stop it and call us, even if the reason is obvious, because of the risk of a rare side-effect.   ?It is best to never miss the medication.  However, if you do miss it, next best is to double up the next time.   ?Please come back for a follow-up appointment in 3 months.   ?

## 2022-02-28 ENCOUNTER — Other Ambulatory Visit: Payer: Self-pay

## 2022-02-28 DIAGNOSIS — A048 Other specified bacterial intestinal infections: Secondary | ICD-10-CM

## 2022-03-06 ENCOUNTER — Other Ambulatory Visit: Payer: No Typology Code available for payment source

## 2022-03-06 DIAGNOSIS — A048 Other specified bacterial intestinal infections: Secondary | ICD-10-CM

## 2022-03-19 ENCOUNTER — Other Ambulatory Visit: Payer: No Typology Code available for payment source

## 2022-03-19 DIAGNOSIS — A048 Other specified bacterial intestinal infections: Secondary | ICD-10-CM

## 2022-03-20 LAB — HELICOBACTER PYLORI  SPECIAL ANTIGEN
MICRO NUMBER:: 13248038
SPECIMEN QUALITY: ADEQUATE

## 2022-03-21 NOTE — Progress Notes (Signed)
Beth King,  ?Your H. pylori stool test was negative indicating that the bacteria has been eradicated with antibiotics.  Please follow-up with me as needed in the clinic for further management of your chronic GI symptoms.

## 2022-04-21 ENCOUNTER — Other Ambulatory Visit (HOSPITAL_COMMUNITY): Payer: Self-pay

## 2022-04-23 ENCOUNTER — Other Ambulatory Visit (HOSPITAL_COMMUNITY): Payer: Self-pay

## 2022-04-23 MED ORDER — WEGOVY 1 MG/0.5ML ~~LOC~~ SOAJ
1.0000 mg | SUBCUTANEOUS | 0 refills | Status: DC
  Filled 2022-04-23 – 2022-04-25 (×2): qty 2, 28d supply, fill #0

## 2022-04-25 ENCOUNTER — Other Ambulatory Visit (HOSPITAL_COMMUNITY): Payer: Self-pay

## 2022-06-26 ENCOUNTER — Encounter: Payer: Self-pay | Admitting: Neurology

## 2022-06-26 ENCOUNTER — Ambulatory Visit (INDEPENDENT_AMBULATORY_CARE_PROVIDER_SITE_OTHER): Payer: No Typology Code available for payment source | Admitting: Neurology

## 2022-06-26 VITALS — BP 132/89 | HR 74 | Ht 64.0 in | Wt 213.0 lb

## 2022-06-26 DIAGNOSIS — G43711 Chronic migraine without aura, intractable, with status migrainosus: Secondary | ICD-10-CM

## 2022-06-26 NOTE — Patient Instructions (Addendum)
Daily qulipta samples until botox on board once daily - every day no matter what Nurtec as needed once daily for migraine - only as needed Botox for migraine  [facility] - referral  Wegovy or mounjaro are great - talk to obgyn and pcp   Rimegepant Disintegrating Tablets What is this medication? RIMEGEPANT (ri ME je pant) prevents and treats migraines. It works by blocking a substance in the body that causes migraines. This medicine may be used for other purposes; ask your health care provider or pharmacist if you have questions. COMMON BRAND NAME(S): NURTEC ODT What should I tell my care team before I take this medication? They need to know if you have any of these conditions: Kidney disease Liver disease An unusual or allergic reaction to rimegepant, other medications, foods, dyes, or preservatives Pregnant or trying to get pregnant Breast-feeding How should I use this medication? Take this medication by mouth. Take it as directed on the prescription label. Leave the tablet in the sealed pack until you are ready to take it. With dry hands, open the pack and gently remove the tablet. If the tablet breaks or crumbles, throw it away. Use a new tablet. Place the tablet in the mouth and allow it to dissolve. Then, swallow it. Do not cut, crush, or chew this medication. You do not need water to take this medication. Talk to your care team about the use of this medication in children. Special care may be needed. Overdosage: If you think you have taken too much of this medicine contact a poison control center or emergency room at once. NOTE: This medicine is only for you. Do not share this medicine with others. What if I miss a dose? This does not apply. This medication is not for regular use. What may interact with this medication? Certain medications for fungal infections, such as fluconazole, itraconazole Rifampin This list may not describe all possible interactions.  Give your health care provider a list of all the medicines, herbs, non-prescription drugs, or dietary supplements you use. Also tell them if you smoke, drink alcohol, or use illegal drugs. Some items may interact with your medicine. What should I watch for while using this medication? Visit your care team for regular checks on your progress. Tell your care team if your symptoms do not start to get better or if they get worse. What side effects may I notice from receiving this medication? Side effects that you should report to your care team as soon as possible: Allergic reactions--skin rash, itching, hives, swelling of the face, lips, tongue, or throat Side effects that usually do not require medical attention (report to your care team if they continue or are bothersome): Nausea Stomach pain This list may not describe all possible side effects. Call your doctor for medical advice about side effects. You may report side effects to FDA at 1-800-FDA-1088. Where should I keep my medication? Keep out of the reach of children and pets. Store at room temperature between 20 and 25 degrees C (68 and 77 degrees F). Get rid of any unused medication after the expiration date. To get rid of medications that are no longer needed or have expired: Take the medication to a medication take-back program. Check with your pharmacy or law enforcement to find a location. If you cannot return the medication, check the label or package insert to see if the medication should be thrown out in the garbage or flushed down the toilet. If you are  not sure, ask your care team. If it is safe to put it in the trash, take the medication out of the container. Mix the medication with cat litter, dirt, coffee grounds, or other unwanted substance. Seal the mixture in a bag or container. Put it in the trash. NOTE: This sheet is a summary. It may not cover all possible information. If you have questions about this medicine, talk to your  doctor, pharmacist, or health care provider.  2023 Elsevier/Gold Standard (2022-01-16 00:00:00) Atogepant Tablets What is this medication? ATOGEPANT (a TOE je pant) prevents migraines. It works by blocking a substance in the body that causes migraines. This medicine may be used for other purposes; ask your health care provider or pharmacist if you have questions. COMMON BRAND NAME(S): QULIPTA What should I tell my care team before I take this medication? They need to know if you have any of these conditions: Kidney disease Liver disease An unusual or allergic reaction to atogepant, other medications, foods, dyes, or preservatives Pregnant or trying to get pregnant Breast-feeding How should I use this medication? Take this medication by mouth with water. Take it as directed on the prescription label at the same time every day. You can take it with or without food. If it upsets your stomach, take it with food. Keep taking it unless your care team tells you to stop. Talk to your care team about the use of this medication in children. Special care may be needed. Overdosage: If you think you have taken too much of this medicine contact a poison control center or emergency room at once. NOTE: This medicine is only for you. Do not share this medicine with others. What if I miss a dose? If you miss a dose, take it as soon as you can. If it is almost time for your next dose, take only that dose. Do not take double or extra doses. What may interact with this medication? Carbamazepine Certain medications for fungal infections, such as itraconazole, ketoconazole Clarithromycin Cyclosporine Efavirenz Etravirine Phenytoin Rifampin St. John's wort This list may not describe all possible interactions. Give your health care provider a list of all the medicines, herbs, non-prescription drugs, or dietary supplements you use. Also tell them if you smoke, drink alcohol, or use illegal drugs. Some items  may interact with your medicine. What should I watch for while using this medication? Visit your care team for regular checks on your progress. Tell your care team if your symptoms do not start to get better or if they get worse. What side effects may I notice from receiving this medication? Side effects that you should report to your care team as soon as possible: Allergic reactions--skin rash, itching, hives, swelling of the face, lips, tongue, or throat Side effects that usually do not require medical attention (report to your care team if they continue or are bothersome): Constipation Fatigue Loss of appetite with weight loss Nausea This list may not describe all possible side effects. Call your doctor for medical advice about side effects. You may report side effects to FDA at 1-800-FDA-1088. Where should I keep my medication? Keep out of the reach of children and pets. Store at room temperature between 20 and 25 degrees C (68 and 77 degrees F). Get rid of any unused medication after the expiration date. To get rid of medications that are no longer needed or have expired: Take the medication to a medication take-back program. Check with your pharmacy or law enforcement to find a  location. If you cannot return the medication, check the label or package insert to see if the medication should be thrown out in the garbage or flushed down the toilet. If you are not sure, ask your care team. If it is safe to put it in the trash, take the medication out of the container. Mix the medication with cat litter, dirt, coffee grounds, or other unwanted substance. Seal the mixture in a bag or container. Put it in the trash. NOTE: This sheet is a summary. It may not cover all possible information. If you have questions about this medicine, talk to your doctor, pharmacist, or health care provider.  2023 Elsevier/Gold Standard (2022-01-14 00:00:00)

## 2022-06-26 NOTE — Progress Notes (Signed)
[facility]    Provider:  Dr Jaynee Eagles Referring Provider: Donald Prose, MD Primary Care Physician:  Donald Prose, MD  CC:  migraines  08/2021: This is a lovely patient who is here for migraines we saw her back in 2019.  At that time we discussed options we started on Qudexy for prevention and Amerge for acute management.  Since then she has had a normal MRI of the brain in 2022.  She was a previous patient of Dr. Erling Cruz a fluids, migraines that started in 2016 she reports severe pain on the left side of the head nausea vomiting light sensitivity noise and movement makes it worse being in a dark room helps.  Her and Dr. Trellis Moment it had been trying multiple medications she was taking Amerge for acute management but at that time a she was having worsening headaches when I saw her, whether was a trigger, they were severe and could last 24 hours without treatment, at that time she was having daily headaches more than 15 a month taking ibuprofen without medication overuse, no posterior head pain, no significant caffeine just a second, at the time she really was not sure if she snored or if sleep apnea might be an issue with her migraines.  This is a review of my records and also Dr. Bobby Rumpf records from 2019 and prior.  Patient is here alone today.  And she states that she has severe blurry vision onyu with the migraine, recently saw eye doctor and exam was fine, all think she was doing very well however migraines have been worsening especially with weather changes that can be triggering and now she is doing not as well as she used to.  She has daily migraines > 1 year, no aura, no medication overuse, all day, barely surviving, the headaches can be pulsating pounding throbbing light sensitivity sound sensitivity nausea  remote vomiting. She also has a lot of pressure related headaches, no hearing changes, she blinks her eyes and the vision improved. She has gained weight 15 pounds, the migraines can  travel and can be unilateral or both sides. Hurts to move. No other focal neurologic deficits, associated symptoms, inciting events or modifiable factors.   Medications tried could not tolerate: labetalol, topiramate, amitrip/nortrip, amerge, sumatriptan. Maxalt, ibuprofrn, zofrn, qudexy, depakote, candesartan, verapamil.   MRI 2022 IMPRESSION: Normal examination. No abnormality seen to explain the clinical presentation.(Reviewed images and agree)    Patient complains of symptoms per HPI as well as the following symptoms: migraines . Pertinent negatives and positives per HPI. All others negative   HPI:  Beth King is a 46 y.o. female here as a referral from Dr. Nancy Fetter for migraines.  Past medical history migraine, fibroid, hypothyroidism, prediabetes.  She is a previous patient of Dr. Riley Nearing. No FHx. Migraines started 3 years ago. She has severe pain on the left side of the head,nausea, vomiting, kight sensitivity and noise and movement worsened headache, being in a dark room helped. They have been trying different medication. She takes Amerge for acute management. For the last 6 months worsening, Worse with the weather. They are severe and can last 24 hours without treatment. She has daily headaches, >15 a month are migrainous. She takes ibuprofen no more than 10 days a month. She has 7 severe migraines a month. Her headaches are positional and wake her up, they can be in the posterior head.No significant caffeine. She doesn't know if she snores. She wakes up with headaches. She feels these  are not her regular migraines. She has vision changes, she couldn;t read with the right vision loss in the right eye.   Reviewed notes, labs and imaging from outside physicians, which showed:  CBC unremarkable, labs were taken December 26, 2017, CMP with BUN 9 and creatinine 0.79 and otherwise unremarkable, TSH normal, LDL 119, sed rate slightly elevated at 30, ANA negative  She has a headache for the past  week, headache is left-sided, severe enough that she could not lift her head and had blurred vision, have been taking daily digestive enzyme for 2 weeks, she was on a: Please, nauseated for 4 days, patient traveled to [place] 6 months ago, has been fine in between, no fever, has urinary frequency for a long time, no blurred vision no excessive thirst, extended family has diabetes, Mirena IUD was replaced last year, patient has a long history of headaches, migraines, tension, sinus headache.  Headache was 10 out of 10 yesterday.  Did not take anything for it.  She has been on Topamax but she is not taking regularly.  Review of Systems: Patient complains of symptoms per HPI as well as the following symptoms: headache, blurred vision, weight gain, dizziness. Pertinent negatives and positives per HPI. All others negative.   Social History   Socioeconomic History   Marital status: Married    Spouse name: Not on file   Number of children: 0   Years of education: Not on file   Highest education level: Associate degree: occupational, Hotel manager, or vocational program  Occupational History   Occupation: Retail banker  Tobacco Use   Smoking status: Never   Smokeless tobacco: Never  Vaping Use   Vaping Use: Never used  Substance and Sexual Activity   Alcohol use: Yes    Comment: Occassionally  wine   Drug use: No   Sexual activity: Yes  Other Topics Concern   Not on file  Social History Narrative   J[employer]   Lives at home with husband and mother     Right handed   Drinks occasional caffeine   Social Determinants of Health   Financial Resource Strain: Not on file  Food Insecurity: Not on file  Transportation Needs: Not on file  Physical Activity: Not on file  Stress: Not on file  Social Connections: Not on file  Intimate Partner Violence: Not on file    Family History  Problem Relation Age of Onset   Diabetes Mother    Hypertension Mother    Arthritis Father    Thyroid  disease Neg Hx    Migraines Neg Hx     Past Medical History:  Diagnosis Date   Allergic rhinitis    Cardiomegaly 2007   incidental finding, no tx required per pt.   Diabetes mellitus without complication ([place])    Enlarged uterus    Fibroid    Hyperthyroidism    Migraine    Plantar fasciitis    Pre-diabetes    Seasonal allergies     Past Surgical History:  Procedure Laterality Date   MYOMECTOMY N/A 04/24/2021   Procedure: ABDOMINAL MYOMECTOMY;  Surgeon: Sherlyn Hay, DO;  Location: [facility];  Service: Gynecology;  Laterality: N/A;    Current Outpatient Medications  Medication Sig Dispense Refill   acetaminophen (TYLENOL) 500 MG tablet Take 1,000 mg by mouth as needed.     Blood Glucose Monitoring Suppl (FREESTYLE LITE) w/Device KIT Use as directed 1 kit 0   folic acid (FOLVITE) 858 MCG tablet Take 800  mcg by mouth daily.     glucose blood (FREESTYLE LITE) test strip Test once daily as directed 100 each 2   labetalol (NORMODYNE) 100 MG tablet Take 1 tablet (100 mg total) by mouth 2 (two) times daily. 180 tablet 0   labetalol (NORMODYNE) 100 MG tablet Take 1 tablet (100 mg total) by mouth 2 (two) times daily. 180 tablet 1   Lancets (FREESTYLE) lancets Test once daily as directed 100 each 2   metFORMIN (GLUCOPHAGE-XR) 500 MG 24 hr tablet Take 1 tablet (500 mg total) by mouth 2 (two) times daily with meals. 180 tablet 1   metroNIDAZOLE (FLAGYL) 250 MG tablet Take 1 tablet (250 mg total) by mouth 4 (four) times daily. 40 tablet 0   omeprazole (PRILOSEC) 20 MG capsule Take 1 capsule (20 mg total) by mouth daily. 60 capsule 1   omeprazole (PRILOSEC) 20 MG capsule Take 1 capsule (20 mg total) by mouth 2 (two) times daily before a meal. 20 capsule 0   No current facility-administered medications for this visit.    Allergies as of 06/26/2022 - Review Complete 06/26/2022  Allergen Reaction Noted   Chloroquine phosphate [chloroquine] Itching 01/09/2018    Vitals: BP 132/89    Pulse 74   Ht '5\' 4"'  (1.626 m)   Wt 213 lb (96.6 kg)   BMI 36.56 kg/m  Last Weight:  Wt Readings from Last 1 Encounters:  06/26/22 213 lb (96.6 kg)   Last Height:   Ht Readings from Last 1 Encounters:  06/26/22 '5\' 4"'  (1.626 m)    Physical exam: Exam: Gen: NAD, conversant, well nourised, obese, well groomed                     CV: RRR, no MRG. No Carotid Bruits. No peripheral edema, warm, nontender Eyes: Conjunctivae clear without exudates or hemorrhage  Neuro: Detailed Neurologic Exam  Speech:    Speech is normal; fluent and spontaneous with normal comprehension.  Cognition:    The patient is oriented to person, place, and time;     recent and remote memory intact;     language fluent;     normal attention, concentration,     fund of knowledge Cranial Nerves:    The pupils are equal, round, and reactive to light. The fundi are normal and spontaneous venous pulsations are present. Visual fields are full to finger confrontation. Extraocular movements are intact. Trigeminal sensation is intact and the muscles of mastication are normal. The face is symmetric. The palate elevates in the midline. Hearing intact. Voice is normal. Shoulder shrug is normal. The tongue has normal motion without fasciculations.   Coordination:    Normal finger to nose and heel to shin. Normal rapid alternating movements.   Gait:    Heel-toe and tandem gait are normal.   Motor Observation:    No asymmetry, no atrophy, and no involuntary movements noted. Tone:    Normal muscle tone.    Posture:    Posture is normal. normal erect    Strength:    Strength is V/V in the upper and lower limbs.      Sensation: intact to LT     Reflex Exam:  DTR's:    Deep tendon reflexes in the upper and lower extremities are normal bilaterally.   Toes:    The toes are downgoing bilaterally.   Clonus:    Clonus is absent.     Assessment/Plan:  46 year old patient with daily migraines>1 year  moderate to  severe no aura no med overuse. Medications tried could not tolerate: labetalol, topiramate, amitrip/nortrip, amerge, sumatriptan. Maxalt, ibuprofrn, zofrn, qudexy, depakote, candesartan, verapamil, qudexy, emgality  Botox for migraine  [facility] - referral  Wegovy or mounjaro are great - talk to obgyn and pcp  Orders Placed This Encounter  Procedures   Ambulatory referral to Family Practice   Cc: Dr. Nancy Fetter  Discussed: To prevent or relieve headaches, try the following: Cool Compress. Lie down and place a cool compress on your head.  Avoid headache triggers. If certain foods or odors seem to have triggered your migraines in the past, avoid them. A headache diary might help you identify triggers.  Include physical activity in your daily routine. Try a daily walk or other moderate aerobic exercise.  Manage stress. Find healthy ways to cope with the stressors, such as delegating tasks on your to-do list.  Practice relaxation techniques. Try deep breathing, yoga, massage and visualization.  Eat regularly. Eating regularly scheduled meals and maintaining a healthy diet might help prevent headaches. Also, drink plenty of fluids.  Follow a regular sleep schedule. Sleep deprivation might contribute to headaches Consider biofeedback. With this mind-body technique, you learn to control certain bodily functions -- such as muscle tension, heart rate and blood pressure -- to prevent headaches or reduce headache pain.    Proceed to emergency room if you experience new or worsening symptoms or symptoms do not resolve, if you have new neurologic symptoms or if headache is severe, or for any concerning symptom.   Provided education and documentation from American headache Society toolbox including articles on: chronic migraine medication overuse headache, chronic migraines, prevention of migraines, behavioral and other nonpharmacologic treatments for headache.   Sarina Ill,  MD  [facility] [address] [place], [place] [address]  Phone [phone] Fax [phone]  I spent 60 minutes of face-to-face and non-face-to-face time with patient on the  1. Chronic migraine without aura, intractable, with status migrainosus   2. Morbid obesity ([place])    diagnosis.  This included previsit chart review, lab review, study review, order entry, electronic health record documentation, patient education on the different diagnostic and therapeutic options, counseling and coordination of care, risks and benefits of management, compliance, or risk factor reduction

## 2022-06-27 ENCOUNTER — Other Ambulatory Visit (HOSPITAL_COMMUNITY): Payer: Self-pay

## 2022-06-28 ENCOUNTER — Encounter: Payer: Self-pay | Admitting: Neurology

## 2022-07-10 ENCOUNTER — Other Ambulatory Visit (HOSPITAL_COMMUNITY): Payer: Self-pay

## 2022-07-10 MED ORDER — MOUNJARO 2.5 MG/0.5ML ~~LOC~~ SOAJ
2.5000 mg | SUBCUTANEOUS | 0 refills | Status: DC
  Filled 2022-07-10: qty 2, 28d supply, fill #0

## 2022-07-17 ENCOUNTER — Telehealth: Payer: Self-pay | Admitting: Neurology

## 2022-07-17 NOTE — Telephone Encounter (Signed)
I called [facility] spoke with Rep Elwin Sleight ref # [id number] to initiate PA. She transferred me over to [place] who puts the PA in place. I spoke to rep [place], she initiated the PA. Faxed over clinical notes to [phone].

## 2022-08-06 ENCOUNTER — Other Ambulatory Visit (HOSPITAL_COMMUNITY): Payer: Self-pay

## 2022-08-07 ENCOUNTER — Ambulatory Visit (INDEPENDENT_AMBULATORY_CARE_PROVIDER_SITE_OTHER): Payer: No Typology Code available for payment source

## 2022-08-07 ENCOUNTER — Encounter: Payer: Self-pay | Admitting: Podiatry

## 2022-08-07 ENCOUNTER — Ambulatory Visit (INDEPENDENT_AMBULATORY_CARE_PROVIDER_SITE_OTHER): Payer: No Typology Code available for payment source | Admitting: Podiatry

## 2022-08-07 DIAGNOSIS — M722 Plantar fascial fibromatosis: Secondary | ICD-10-CM

## 2022-08-07 DIAGNOSIS — M76821 Posterior tibial tendinitis, right leg: Secondary | ICD-10-CM

## 2022-08-07 MED ORDER — TRIAMCINOLONE ACETONIDE 10 MG/ML IJ SUSP
20.0000 mg | Freq: Once | INTRAMUSCULAR | Status: AC
  Administered 2022-08-07: 20 mg

## 2022-08-07 NOTE — Progress Notes (Signed)
Subjective:   Patient ID: Beth King, female   DOB: 46 y.o.   MRN: [id number]   HPI Patient presents stating she is developed a lot of pain in her right ankle and her left heel recently.  States she had a long-term history of these problems but its been manageable and has become worse over the last short period of time.  Patient states that she is walking abnormally because of this does not smoke likes to be active   Review of Systems  All other systems reviewed and are negative.       Objective:  Physical Exam Vitals and nursing note reviewed.  Constitutional:      Appearance: She is well-developed.  Pulmonary:     Effort: Pulmonary effort is normal.  Musculoskeletal:        General: Normal range of motion.  Skin:    General: Skin is warm.  Neurological:     Mental Status: She is alert.     Neurovascular status found to be intact muscle strength found to be adequate range of motion within normal limits.  Patient is found to have inflammation pain around the posterior tibial tendon right underneath the medial malleolus with inflammation near its insertion navicular and also has discomfort in the plantar heel left with inflammation fluid buildup.  Also noted to have flatfoot deformity no loss of motion at this time     Assessment:  Acute posterior tibial tendinitis right plantar fasciitis left     Plan:  H&P reviewed conditions.  At this point I focused on both feet I went ahead today I recommended conservative care discussed the flatfoot deformity the importance of inserts and she had previous orthotics that like her to bring in if she has.  I went ahead I explained injections and risk patient wants to have this done I did a careful sterile prep injected the posterior tibial tendon under the malleolus near the navicular 3 mg Dexasone Kenalog 5 g Xylocaine into the sheath and the plantar heel left 3 mg Kenalog 5 mg Xylocaine and for the right I applied fascial brace to lift up  the arch  X-rays indicate significant flatfoot deformity bilateral no other indications of pathology

## 2022-08-09 ENCOUNTER — Other Ambulatory Visit (HOSPITAL_COMMUNITY): Payer: Self-pay

## 2022-08-09 MED ORDER — MOUNJARO 2.5 MG/0.5ML ~~LOC~~ SOAJ
2.5000 mg | SUBCUTANEOUS | 0 refills | Status: DC
  Filled 2022-08-09: qty 6, 84d supply, fill #0

## 2022-08-22 ENCOUNTER — Encounter: Payer: Self-pay | Admitting: Podiatry

## 2022-08-22 ENCOUNTER — Ambulatory Visit (INDEPENDENT_AMBULATORY_CARE_PROVIDER_SITE_OTHER): Payer: No Typology Code available for payment source | Admitting: Podiatry

## 2022-08-22 DIAGNOSIS — T148XXA Other injury of unspecified body region, initial encounter: Secondary | ICD-10-CM | POA: Diagnosis not present

## 2022-08-22 DIAGNOSIS — M722 Plantar fascial fibromatosis: Secondary | ICD-10-CM

## 2022-08-22 DIAGNOSIS — M76821 Posterior tibial tendinitis, right leg: Secondary | ICD-10-CM

## 2022-08-23 NOTE — Progress Notes (Signed)
Subjective:   Patient ID: Beth King, female   DOB: 46 y.o.   MRN: [id number]   HPI Patient presents stating left heel is feeling some better but the right ankle continues to bother her and swell.  States that she had short-term relief for a few days but its come back to a significant nature   ROS      Objective:  Physical Exam  Neuro vascular status intact with left plantar fascia moderately improved still mildly tender in the right medial ankle that continues to be inflammation and pain and stress on the posterior tibial tendon     Assessment:  Fasciitis moderately improved left with what appears to be acute posterior tibial tendinitis right with the possibility for interstitial tear due to the swelling component     Plan:  We are going to try immobilization first with cam walker and depending on results of that we may order MRI to try to rule out interstitial tear with the possibility that long-term this is can require surgical intervention depending on the results of the MRI.  Today Cam walker was fitted to the right lower leg properly all instructions on usage and she will wear it is much as possible

## 2022-09-10 ENCOUNTER — Other Ambulatory Visit (HOSPITAL_COMMUNITY): Payer: Self-pay

## 2022-09-11 ENCOUNTER — Other Ambulatory Visit (HOSPITAL_COMMUNITY): Payer: Self-pay

## 2022-09-23 ENCOUNTER — Ambulatory Visit (INDEPENDENT_AMBULATORY_CARE_PROVIDER_SITE_OTHER): Payer: No Typology Code available for payment source | Admitting: Internal Medicine

## 2022-09-23 ENCOUNTER — Encounter: Payer: Self-pay | Admitting: Internal Medicine

## 2022-09-23 VITALS — BP 130/84 | HR 90 | Ht 64.0 in | Wt 215.0 lb

## 2022-09-23 DIAGNOSIS — E0590 Thyrotoxicosis, unspecified without thyrotoxic crisis or storm: Secondary | ICD-10-CM | POA: Diagnosis not present

## 2022-09-23 LAB — TSH: TSH: 0.57 u[IU]/mL (ref 0.35–5.50)

## 2022-09-23 LAB — T4, FREE: Free T4: 0.73 ng/dL (ref 0.60–1.60)

## 2022-09-23 NOTE — Progress Notes (Signed)
Name: Beth King  MRN/ DOB: [id number], [birth year]    Age/ Sex: 46 y.o., female     PCP: Donald Prose, MD   Reason for Endocrinology Evaluation: Hyperthyroidism     Initial Endocrinology Clinic Visit: 04/09/2016    PATIENT IDENTIFIER: Beth King is a 46 y.o., female with a past medical history of Hyperthyroidism. She has followed with [facility] since 04/09/2016 for consultative assistance with management of her hyperthyroidism.   HISTORICAL SUMMARY: The patient was first diagnosed with hyperthyroidism in 2017. This has been attributed to the possibility of Graves' disease No prior imaging  She has been on methimazole and PTU in the past    She had a miscarriage in 2022  No Fh of thyroid disease   SUBJECTIVE:    Today (09/23/2022):  Ms. Beth King is here for a follow up on hyperthyroidism.   She has PTU at home and has been using it as needed for palpitations  which averages once a month  Last dose was 2 days   She does not like the taste of  it   Weight stable  She has occasional palpitations  Denies palpitations  Denies local neck swelling  Denies eye symptoms of itching or burning in the eyes     HISTORY:  Past Medical History:  Past Medical History:  Diagnosis Date   Allergic rhinitis    Cardiomegaly 2007   incidental finding, no tx required per pt.   Diabetes mellitus without complication ([place])    Enlarged uterus    Fibroid    Hyperthyroidism    Migraine    Plantar fasciitis    Pre-diabetes    Seasonal allergies    Past Surgical History:  Past Surgical History:  Procedure Laterality Date   MYOMECTOMY N/A 04/24/2021   Procedure: ABDOMINAL MYOMECTOMY;  Surgeon: Sherlyn Hay, DO;  Location: [facility];  Service: Gynecology;  Laterality: N/A;   Social History:  reports that she has never smoked. She has never used smokeless tobacco. She reports current alcohol use. She reports that she does not use drugs. Family History:   Family History  Problem Relation Age of Onset   Diabetes Mother    Hypertension Mother    Arthritis Father    Thyroid disease Neg Hx    Migraines Neg Hx      HOME MEDICATIONS: Allergies as of 09/23/2022       Reactions   Chloroquine Phosphate [chloroquine] Itching   "itching all over my body for almost like a week"        Medication List        Accurate as of September 23, 2022  4:09 PM. If you have any questions, ask your nurse or doctor.          STOP taking these medications    metFORMIN 500 MG 24 hr tablet Commonly known as: GLUCOPHAGE-XR Stopped by: Dorita Sciara, MD   metroNIDAZOLE 250 MG tablet Commonly known as: FLAGYL Stopped by: Dorita Sciara, MD       TAKE these medications    acetaminophen 500 MG tablet Commonly known as: TYLENOL Take 1,000 mg by mouth as needed.   folic acid 650 MCG tablet Commonly known as: FOLVITE Take 800 mcg by mouth daily.   freestyle lancets Test once daily as directed   FREESTYLE LITE test strip Generic drug: glucose blood Test once daily as directed   FreeStyle Lite w/Device Kit Use as directed   labetalol 100 MG  tablet Commonly known as: NORMODYNE Take 1 tablet (100 mg total) by mouth 2 (two) times daily. What changed: Another medication with the same name was removed. Continue taking this medication, and follow the directions you see here. Changed by: Dorita Sciara, MD   Mounjaro 2.5 MG/0.5ML Pen Generic drug: tirzepatide Inject 2.5 mg into the skin once a week.   omeprazole 20 MG capsule Commonly known as: PRILOSEC Take 1 capsule (20 mg total) by mouth 2 (two) times daily before a meal. What changed: Another medication with the same name was removed. Continue taking this medication, and follow the directions you see here. Changed by: Dorita Sciara, MD          OBJECTIVE:   PHYSICAL EXAM: VS: BP 130/84 (BP Location: Left Arm, Patient Position: Sitting, Cuff Size:  Large)   Pulse 90   Ht '5\' 4"'  (1.626 m)   Wt 215 lb (97.5 kg)   SpO2 98%   BMI 36.90 kg/m    EXAM: General: Pt appears well and is in NAD  Eyes: External eye exam normal without stare, lid lag or exophthalmos.  EOM intact.    Neck: General: Supple without adenopathy. Thyroid: Thyroid size normal.  No goiter or nodules appreciated. No thyroid bruit.  Lungs: Clear with good BS bilat with no rales, rhonchi, or wheezes  Heart: Auscultation: RRR.  Abdomen: Normoactive bowel sounds, soft, nontender, without masses or organomegaly palpable  Extremities:  BL LE: No pretibial edema normal ROM and strength.  Mental Status: Judgment, insight: Intact Orientation: Oriented to time, place, and person Mood and affect: No depression, anxiety, or agitation     DATA REVIEWED:   Latest Reference Range & Units 09/23/22 14:31  TSH 0.35 - 5.50 uIU/mL 0.57  T4,Free(Direct) 0.60 - 1.60 ng/dL 0.73   Old records , labs and images have been reviewed.    ASSESSMENT / PLAN / RECOMMENDATIONS:   Hyperthyroidism  - Pt is clinically euthyroid  - No local neck symptoms  - She used to be on methimazole but tis was switched to PTU during pregnancy, she does not like the taste of it but her previously endocrinologist has not changed her back to methimazole and has been using PTU for palpitations on average once a month  - I have advised her to stop PTU, will recheck labs today and if normal again in 6 weeks  - If thionamide therapy is needed , will start her back on methimazole  - She is in agreement of this plan  -Repeat TFTs are normal, will hold off on any thionamides therapy at this time   Medications   F/U in 6 months  Repeat labs in 6 weeks  Signed electronically by: Mack Guise, MD  [facility]  [facility] [address] [place], [place] [address] Phone: [phone] FAX: [phone]      CC: Donald Prose, [address] [place] [address] Phone: [phone]  Fax: [phone]   Return to Endocrinology clinic as below: Future Appointments  Date Time Provider [place]  09/30/2022 10:45 AM Wallene Huh, DPM [facility]  11/04/2022  2:15 PM [facility] None  05/12/2023  1:00 PM Revere Maahs, Melanie Crazier, MD [facility] None

## 2022-09-24 LAB — T3: T3, Total: 100 ng/dL (ref 76–181)

## 2022-09-25 ENCOUNTER — Other Ambulatory Visit: Payer: Self-pay | Admitting: Family Medicine

## 2022-09-25 DIAGNOSIS — Z1231 Encounter for screening mammogram for malignant neoplasm of breast: Secondary | ICD-10-CM

## 2022-09-30 ENCOUNTER — Ambulatory Visit: Payer: No Typology Code available for payment source | Admitting: Podiatry

## 2022-10-09 ENCOUNTER — Ambulatory Visit: Payer: No Typology Code available for payment source | Admitting: Podiatry

## 2022-10-14 ENCOUNTER — Ambulatory Visit (INDEPENDENT_AMBULATORY_CARE_PROVIDER_SITE_OTHER): Payer: No Typology Code available for payment source | Admitting: Orthopedic Surgery

## 2022-10-14 ENCOUNTER — Encounter: Payer: Self-pay | Admitting: Orthopedic Surgery

## 2022-10-14 DIAGNOSIS — M7551 Bursitis of right shoulder: Secondary | ICD-10-CM | POA: Diagnosis not present

## 2022-10-14 DIAGNOSIS — M792 Neuralgia and neuritis, unspecified: Secondary | ICD-10-CM

## 2022-10-14 MED ORDER — LIDOCAINE HCL 1 % IJ SOLN
5.0000 mL | INTRAMUSCULAR | Status: AC | PRN
  Administered 2022-10-14: 5 mL

## 2022-10-14 MED ORDER — BUPIVACAINE HCL 0.5 % IJ SOLN
9.0000 mL | INTRAMUSCULAR | Status: AC | PRN
  Administered 2022-10-14: 9 mL via INTRA_ARTICULAR

## 2022-10-14 MED ORDER — METHYLPREDNISOLONE ACETATE 40 MG/ML IJ SUSP
40.0000 mg | INTRAMUSCULAR | Status: AC | PRN
  Administered 2022-10-14: 40 mg via INTRA_ARTICULAR

## 2022-10-14 NOTE — Progress Notes (Signed)
Office Visit Note   Patient: Beth King           Date of Birth: 1976-01-03           MRN: [id number] Visit Date: 10/14/2022 Requested by: Donald Prose, [address] [place],  [place] [address] PCP: Donald Prose, MD  Subjective: Chief Complaint  Patient presents with   Right Shoulder - Pain    HPI: Beth King is a 46 y.o. female who presents to the office reporting right shoulder and arm pain for the past 3 months.  Denies a history of injury.  She is right-hand dominant.  The pain wakes her from sleep at night.  Increasing pain with cold weather.  Hard for her to lay on that right-hand side.  She has tried chiropractic treatment without relief.  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  She has had an ultrasound on the shoulder.  Outside radiographs are reviewed of the shoulder and cervical spine.  Shoulder radiographs unremarkable.  Cervical spine radiographs also unremarkable.  She does report some pain in the deltoid region along with mild AC joint pain.  Pain initiated after a fall on the shoulder 3 months ago.  She was exercising prior to that.  She does report some occasional numbness extending down from the shoulder to the elbow.  She describes this as a "deep bone pain"..                ROS: All systems reviewed are negative as they relate to the chief complaint within the history of present illness.  Patient denies fevers or chills.  Assessment & Plan: Visit Diagnoses:  1. Radicular pain in right arm     Plan: Impression is possible radiculopathy with the numbness component of her symptoms now ongoing for 3 months following a fall.  She may also have a component of bursitis.  For diagnostic and therapeutic purposes today a subacromial injection is performed.  MRI C-spine indicated to evaluate right-sided radiculopathy.  Follow-up after that study.  May consider cervical ESI's at that and for further shoulder imaging depending on  her response to the injection.  Follow-Up Instructions: No follow-ups on file.   Orders:  Orders Placed This Encounter  Procedures   MR Cervical Spine w/o contrast   No orders of the defined types were placed in this encounter.     Procedures: Large Joint Inj: R subacromial bursa on 10/14/2022 9:27 PM Indications: diagnostic evaluation and pain Details: 18 G 1.5 in needle, posterior approach  Arthrogram: No  Medications: 9 mL bupivacaine 0.5 %; 40 mg methylPREDNISolone acetate 40 MG/ML; 5 mL lidocaine 1 % Outcome: tolerated well, no immediate complications Procedure, treatment alternatives, risks and benefits explained, specific risks discussed. Consent was given by the patient. Immediately prior to procedure a time out was called to verify the correct patient, procedure, equipment, support staff and site/side marked as required. Patient was prepped and draped in the usual sterile fashion.       Clinical Data: No additional findings.  Objective: Vital Signs: There were no vitals taken for this visit.  Physical Exam:  Constitutional: Patient appears well-developed HEENT:  Head: Normocephalic Eyes:EOM are normal Neck: Normal range of motion Cardiovascular: Normal rate Pulmonary/chest: Effort normal Neurologic: Patient is alert Skin: Skin is warm Psychiatric: Patient has normal mood and affect  Ortho Exam: Ortho exam demonstrates pretty reasonable cervical spine range of motion flexion chin to chest extension  only about 30 degrees rotation 50 degrees bilaterally.  Has mild paresthesias in that C5-6 distribution but not below the elbow.  EPL FPL interosseous strength intact.  5 out of 5 bicep triceps and deltoid strength with good rotator cuff strength on the right infraspinatus supraspinatus and subscap muscle testing.  No discrete AC joint tenderness right versus left.  Does have positive impingement signs on the right with negative O'Brien's testing and only mild biceps  tendon tenderness in the bicipital groove right versus left.  Very muscular shoulder present.  No scapular dyskinesia with forward flexion.  Radial pulse intact bilaterally.  Specialty Comments:  No specialty comments available.  Imaging: No results found.   PMFS History: Patient Active Problem List   Diagnosis Date Noted   Uterine leiomyoma 04/24/2021   S/P myomectomy 04/24/2021   Status post myomectomy 04/24/2021   Weight gain 04/25/2020   Polyuria 10/10/2017   Arthralgia 10/15/2016   Plantar fasciitis 10/15/2016   Hyperthyroidism    Migraine    Allergic rhinitis    Past Medical History:  Diagnosis Date   Allergic rhinitis    Cardiomegaly 2007   incidental finding, no tx required per pt.   Diabetes mellitus without complication ([place])    Enlarged uterus    Fibroid    Hyperthyroidism    Migraine    Plantar fasciitis    Pre-diabetes    Seasonal allergies     Family History  Problem Relation Age of Onset   Diabetes Mother    Hypertension Mother    Arthritis Father    Thyroid disease Neg Hx    Migraines Neg Hx     Past Surgical History:  Procedure Laterality Date   MYOMECTOMY N/A 04/24/2021   Procedure: ABDOMINAL MYOMECTOMY;  Surgeon: Sherlyn Hay, DO;  Location: [facility];  Service: Gynecology;  Laterality: N/A;   Social History   Occupational History   Occupation: Retail banker  Tobacco Use   Smoking status: Never   Smokeless tobacco: Never  Vaping Use   Vaping Use: Never used  Substance and Sexual Activity   Alcohol use: Yes    Comment: Occassionally  wine   Drug use: No   Sexual activity: Yes

## 2022-10-24 ENCOUNTER — Other Ambulatory Visit (HOSPITAL_COMMUNITY): Payer: Self-pay

## 2022-10-28 ENCOUNTER — Other Ambulatory Visit (HOSPITAL_COMMUNITY): Payer: Self-pay

## 2022-10-28 MED ORDER — MOUNJARO 2.5 MG/0.5ML ~~LOC~~ SOAJ
2.5000 mg | SUBCUTANEOUS | 0 refills | Status: DC
  Filled 2022-10-28: qty 6, 84d supply, fill #0

## 2022-11-03 ENCOUNTER — Ambulatory Visit
Admission: RE | Admit: 2022-11-03 | Discharge: 2022-11-03 | Disposition: A | Discharge status: Home / Self Care | Payer: No Typology Code available for payment source | Source: Ambulatory Visit | Attending: Orthopedic Surgery | Admitting: Orthopedic Surgery

## 2022-11-03 DIAGNOSIS — M792 Neuralgia and neuritis, unspecified: Secondary | ICD-10-CM

## 2022-11-04 ENCOUNTER — Ambulatory Visit (INDEPENDENT_AMBULATORY_CARE_PROVIDER_SITE_OTHER): Payer: No Typology Code available for payment source | Admitting: Orthopedic Surgery

## 2022-11-04 ENCOUNTER — Encounter: Payer: Self-pay | Admitting: Orthopedic Surgery

## 2022-11-04 ENCOUNTER — Other Ambulatory Visit (INDEPENDENT_AMBULATORY_CARE_PROVIDER_SITE_OTHER): Payer: No Typology Code available for payment source

## 2022-11-04 DIAGNOSIS — E0590 Thyrotoxicosis, unspecified without thyrotoxic crisis or storm: Secondary | ICD-10-CM

## 2022-11-04 DIAGNOSIS — M7551 Bursitis of right shoulder: Secondary | ICD-10-CM

## 2022-11-04 LAB — TSH: TSH: 0.89 u[IU]/mL (ref 0.35–5.50)

## 2022-11-04 LAB — T4, FREE: Free T4: 0.75 ng/dL (ref 0.60–1.60)

## 2022-11-05 ENCOUNTER — Encounter: Payer: Self-pay | Admitting: Orthopedic Surgery

## 2022-11-05 LAB — T3: T3, Total: 89 ng/dL (ref 76–181)

## 2022-11-05 NOTE — Progress Notes (Unsigned)
Office Visit Note   Patient: Beth King           Date of Birth: 1976/08/04           MRN: [id number] Visit Date: 11/04/2022 Requested by: Donald Prose, [address] [place],  [place] [address] PCP: Donald Prose, MD  Subjective: Chief Complaint  Patient presents with   Neck - Follow-up    MRI review    HPI: Beth King is a 46 y.o. female who presents to the office reporting neck right arm and right shoulder pain.  She had a fall in June.  Last clinic visit her symptoms were most consistent with radiculopathy.  MRI scan of the cervical spine has been performed which was normal.  She states "the pain is getting worse".  Reports "pain in the bones deep" with weather change.  She states that she hurts from her right shoulder down to her wrist.  Subacromial injection gave her no relief.  Currently not taking any medications.  Denies any left-sided symptoms.  Hemoglobin A1c 6.2.  She also has hypothyroidism..                ROS: All systems reviewed are negative as they relate to the chief complaint within the history of present illness.  Patient denies fevers or chills.  Assessment & Plan: Visit Diagnoses: No diagnosis found.  Plan: Impression is right arm and shoulder pain with unclear etiology.  Plan is Celebrex 100 mg twice a day for 2 weeks with 4-week return and possible radiographs of the elbow at that time.  Could consider MRI scanning of the shoulder at that time as well but her shoulder strength and range of motion on exam is not compelling for shoulder pathology.  That could be a neck step to consider.  Follow-up in 4 weeks.  Follow-Up Instructions: No follow-ups on file.   Orders:  No orders of the defined types were placed in this encounter.  No orders of the defined types were placed in this encounter.     Procedures: No procedures performed   Clinical Data: No additional findings.  Objective: Vital Signs: There were no vitals taken for this  visit.  Physical Exam:  Constitutional: Patient appears well-developed HEENT:  Head: Normocephalic Eyes:EOM are normal Neck: Normal range of motion Cardiovascular: Normal rate Pulmonary/chest: Effort normal Neurologic: Patient is alert Skin: Skin is warm Psychiatric: Patient has normal mood and affect  Ortho Exam: Ortho exam demonstrates good cervical spine range of motion.  No coarse grinding or crepitus with internal/external rotation of that right arm.  No radiation of pain to the biceps.  Negative speeds testing today.  Passive range of motion is maintained bilaterally with motion of of 60/95/170.  No definite paresthesias C5-T1.  Radial pulse intact bilaterally.  Specialty Comments:  No specialty comments available.  Imaging: No results found.   PMFS History: Patient Active Problem List   Diagnosis Date Noted   Uterine leiomyoma 04/24/2021   S/P myomectomy 04/24/2021   Status post myomectomy 04/24/2021   Weight gain 04/25/2020   Polyuria 10/10/2017   Arthralgia 10/15/2016   Plantar fasciitis 10/15/2016   Hyperthyroidism    Migraine    Allergic rhinitis    Past Medical History:  Diagnosis Date   Allergic rhinitis    Cardiomegaly 2007   incidental finding, no tx required per pt.   Diabetes mellitus without complication ([place])    Enlarged uterus    Fibroid  Hyperthyroidism    Migraine    Plantar fasciitis    Pre-diabetes    Seasonal allergies     Family History  Problem Relation Age of Onset   Diabetes Mother    Hypertension Mother    Arthritis Father    Thyroid disease Neg Hx    Migraines Neg Hx     Past Surgical History:  Procedure Laterality Date   MYOMECTOMY N/A 04/24/2021   Procedure: ABDOMINAL MYOMECTOMY;  Surgeon: Sherlyn Hay, DO;  Location: [facility];  Service: Gynecology;  Laterality: N/A;   Social History   Occupational History   Occupation: Retail banker  Tobacco Use   Smoking status: Never   Smokeless tobacco: Never  Vaping  Use   Vaping Use: Never used  Substance and Sexual Activity   Alcohol use: Yes    Comment: Occassionally  wine   Drug use: No   Sexual activity: Yes

## 2022-11-06 ENCOUNTER — Other Ambulatory Visit (HOSPITAL_COMMUNITY): Payer: Self-pay

## 2022-11-06 MED ORDER — CELECOXIB 100 MG PO CAPS
100.0000 mg | ORAL_CAPSULE | Freq: Two times a day (BID) | ORAL | 0 refills | Status: AC
  Filled 2022-11-06: qty 60, 30d supply, fill #0

## 2022-11-13 ENCOUNTER — Ambulatory Visit: Payer: No Typology Code available for payment source

## 2022-11-13 ENCOUNTER — Ambulatory Visit (INDEPENDENT_AMBULATORY_CARE_PROVIDER_SITE_OTHER): Payer: No Typology Code available for payment source | Admitting: Family Medicine

## 2022-11-13 VITALS — BP 115/73 | HR 75 | Temp 98.4°F | Ht 64.0 in | Wt 208.0 lb

## 2022-11-13 DIAGNOSIS — Z6835 Body mass index (BMI) 35.0-35.9, adult: Secondary | ICD-10-CM | POA: Diagnosis not present

## 2022-11-13 DIAGNOSIS — E1169 Type 2 diabetes mellitus with other specified complication: Secondary | ICD-10-CM

## 2022-11-13 DIAGNOSIS — Z0289 Encounter for other administrative examinations: Secondary | ICD-10-CM

## 2022-11-18 ENCOUNTER — Ambulatory Visit
Admission: RE | Admit: 2022-11-18 | Discharge: 2022-11-18 | Disposition: A | Discharge status: Home / Self Care | Payer: No Typology Code available for payment source | Source: Ambulatory Visit | Attending: Family Medicine | Admitting: Family Medicine

## 2022-11-18 DIAGNOSIS — Z1231 Encounter for screening mammogram for malignant neoplasm of breast: Secondary | ICD-10-CM

## 2022-11-25 NOTE — Progress Notes (Unsigned)
Office: [phone]  /  Fax: [phone]   Initial Visit  Beth King was seen in clinic today to evaluate for obesity. She is interested in losing weight to improve overall health and reduce the risk of weight related complications. She presents today to review program treatment options, initial physical assessment, and evaluation.     She was referred by: Specialist (Dr. Jaynee Eagles)  When asked how has your weight affected you? She states: Contributed to medical problems and Having fatigue  Some associated conditions: Diabetes and Other: migraines  Contributing factors: Nutritional  Current nutrition plan: None  Current or previous pharmacotherapy: GLP-1 for diabetes mellitus.    Past medical history includes:   Past Medical History:  Diagnosis Date   Allergic rhinitis    Cardiomegaly 2007   incidental finding, no tx required per pt.   Diabetes mellitus without complication ([facility])    Enlarged uterus    Fibroid    Hyperthyroidism    Migraine    Plantar fasciitis    Pre-diabetes    Seasonal allergies      Objective:   BP 115/73   Pulse 75   Temp 98.4 F (36.9 C)   Ht '5\' 4"'$  (1.626 m)   Wt 208 lb (94.3 kg)   SpO2 97%   BMI 35.70 kg/m  She was weighed on the bioimpedance scale: Body mass index is 35.7 kg/m.  Visceral Fat Rating:11, Body Fat%:42.5.  General:  Alert, oriented and cooperative. Patient is in no acute distress.  Respiratory: Normal respiratory effort, no problems with respiration noted  Extremities: Normal range of motion.    Mental Status: Normal mood and affect. Normal behavior. Normal judgment and thought content.   Assessment and Plan:  1. Type 2 diabetes mellitus with other specified complication, without long-term current use of insulin (HCC) Beth King's last A1c was 6.6 on low dose Mounjaro, but she is eating a higher carbohydrate diet that is rich in plants but low in protein.   Plan: Beth King will continue her Darcel Bayley, and we will recheck  labs when she is fasting at her next visit and review treatment options.   2. Class 2 severe obesity with serious comorbidity and body mass index (BMI) of 35.0 to 35.9 in adult, unspecified obesity type (Beth King) We reviewed weight, biometrics, associated medical conditions and contributing factors with patient. She would benefit from weight loss therapy via a modified calorie, low-carb, high-protein nutritional plan tailored to their REE (resting energy expenditure) which will be determined by indirect calorimetry.  We will also assess for cardiometabolic risk and nutritional derangements via fasting serologies at her next appointment.      Obesity Treatment / Action Plan:  Will be scheduled for indirect calorimetry to determine resting energy expenditure in a fasting state.  This will allow [place] to create a reduced calorie, high-protein meal plan to promote loss of fat mass while preserving muscle mass. Will work on reducing intake of added sugars, simple sugars and processed carbs. Was counseled on nutritional approaches to weight loss and benefits of complex carbs and high quality protein as part of nutritional weight management.  Obesity Education Performed Today:  She was weighed on the bioimpedance scale and results were discussed and documented in the synopsis.  We discussed obesity as a disease and the importance of a more detailed evaluation of all the factors contributing to the disease.  We discussed the importance of long term lifestyle changes which include nutrition, exercise and behavioral modifications as well as the importance  of customizing this to her specific health and social needs.  We discussed the benefits of reaching a healthier weight to alleviate the symptoms of existing conditions and reduce the risks of the biomechanical, metabolic and psychological effects of obesity.  Beth King appears to be in the action stage of change and states they are ready to start  intensive lifestyle modifications and behavioral modifications.  40 minutes was spent today on this visit including the above counseling, pre-visit chart review, and post-visit documentation.  Reviewed by clinician on day of visit: allergies, medications, problem list, medical history, surgical history, family history, social history, and previous encounter notes.   I, Trixie Dredge, am acting as transcriptionist for Dennard Nip, MD  I have personally spent 35 minutes total time today in preparation, patient care, and documentation for this visit, including the following: review of clinical lab tests; review of medical tests/procedures/services.    I have reviewed the above documentation for accuracy and completeness, and I agree with the above. Dennard Nip, MD

## 2022-12-16 ENCOUNTER — Other Ambulatory Visit (HOSPITAL_COMMUNITY): Payer: Self-pay

## 2022-12-16 ENCOUNTER — Ambulatory Visit: Payer: Self-pay | Admitting: Orthopedic Surgery

## 2022-12-18 ENCOUNTER — Other Ambulatory Visit (HOSPITAL_COMMUNITY): Payer: Self-pay

## 2022-12-18 MED ORDER — LEVOCETIRIZINE DIHYDROCHLORIDE 5 MG PO TABS
5.0000 mg | ORAL_TABLET | Freq: Every day | ORAL | 0 refills | Status: DC | PRN
  Filled 2022-12-18: qty 90, 90d supply, fill #0

## 2022-12-24 ENCOUNTER — Ambulatory Visit (INDEPENDENT_AMBULATORY_CARE_PROVIDER_SITE_OTHER): Payer: 59 | Admitting: Family Medicine

## 2022-12-24 ENCOUNTER — Encounter (INDEPENDENT_AMBULATORY_CARE_PROVIDER_SITE_OTHER): Payer: Self-pay | Admitting: Family Medicine

## 2022-12-24 VITALS — BP 122/82 | HR 87 | Temp 98.2°F | Ht 64.0 in | Wt 206.0 lb

## 2022-12-24 DIAGNOSIS — Z7985 Long-term (current) use of injectable non-insulin antidiabetic drugs: Secondary | ICD-10-CM

## 2022-12-24 DIAGNOSIS — R5383 Other fatigue: Secondary | ICD-10-CM

## 2022-12-24 DIAGNOSIS — E0590 Thyrotoxicosis, unspecified without thyrotoxic crisis or storm: Secondary | ICD-10-CM | POA: Diagnosis not present

## 2022-12-24 DIAGNOSIS — R0602 Shortness of breath: Secondary | ICD-10-CM | POA: Diagnosis not present

## 2022-12-24 DIAGNOSIS — I152 Hypertension secondary to endocrine disorders: Secondary | ICD-10-CM

## 2022-12-24 DIAGNOSIS — Z6835 Body mass index (BMI) 35.0-35.9, adult: Secondary | ICD-10-CM | POA: Diagnosis not present

## 2022-12-24 DIAGNOSIS — E1165 Type 2 diabetes mellitus with hyperglycemia: Secondary | ICD-10-CM | POA: Diagnosis not present

## 2022-12-24 DIAGNOSIS — E1169 Type 2 diabetes mellitus with other specified complication: Secondary | ICD-10-CM | POA: Diagnosis not present

## 2022-12-24 DIAGNOSIS — Z1331 Encounter for screening for depression: Secondary | ICD-10-CM

## 2022-12-24 DIAGNOSIS — E1159 Type 2 diabetes mellitus with other circulatory complications: Secondary | ICD-10-CM

## 2022-12-25 ENCOUNTER — Ambulatory Visit (INDEPENDENT_AMBULATORY_CARE_PROVIDER_SITE_OTHER): Payer: 59 | Admitting: Family Medicine

## 2022-12-25 LAB — FOLATE: Folate: 7.7 ng/mL (ref >3.0)

## 2022-12-25 LAB — CBC WITH DIFFERENTIAL/PLATELET
Basophils Absolute: 0.1 10*3/uL (ref 0.0–0.2)
Basos: 1 %
EOS (ABSOLUTE): 0.1 10*3/uL (ref 0.0–0.4)
Eos: 1 %
Hematocrit: 40.2 % (ref 34.0–46.6)
Hemoglobin: 13.4 g/dL (ref 11.1–15.9)
Immature Grans (Abs): 0 10*3/uL (ref 0.0–0.1)
Immature Granulocytes: 1 %
Lymphocytes Absolute: 1.3 10*3/uL (ref 0.7–3.1)
Lymphs: 19 %
MCH: 29 pg (ref 26.6–33.0)
MCHC: 33.3 g/dL (ref 31.5–35.7)
MCV: 87 fL (ref 79–97)
Monocytes Absolute: 0.5 10*3/uL (ref 0.1–0.9)
Monocytes: 7 %
Neutrophils Absolute: 4.8 10*3/uL (ref 1.4–7.0)
Neutrophils: 71 %
Platelets: 385 10*3/uL (ref 150–450)
RBC: 4.62 x10E6/uL (ref 3.77–5.28)
RDW: 14.5 % (ref 11.7–15.4)
WBC: 6.7 10*3/uL (ref 3.4–10.8)

## 2022-12-25 LAB — VITAMIN D 25 HYDROXY (VIT D DEFICIENCY, FRACTURES): Vit D, 25-Hydroxy: 17.7 ng/mL — ABNORMAL LOW (ref 30.0–100.0)

## 2022-12-25 LAB — LIPID PANEL WITH LDL/HDL RATIO
Cholesterol, Total: 215 mg/dL — ABNORMAL HIGH (ref 100–199)
HDL: 78 mg/dL (ref >39)
LDL Chol Calc (NIH): 127 mg/dL — ABNORMAL HIGH (ref 0–99)
LDL/HDL Ratio: 1.6 ratio (ref 0.0–3.2)
Triglycerides: 54 mg/dL (ref 0–149)
VLDL Cholesterol Cal: 10 mg/dL (ref 5–40)

## 2022-12-25 LAB — COMPREHENSIVE METABOLIC PANEL
ALT: 57 IU/L — ABNORMAL HIGH (ref 0–32)
AST: 20 IU/L (ref 0–40)
Albumin/Globulin Ratio: 1.6 (ref 1.2–2.2)
Albumin: 4.7 g/dL (ref 3.9–4.9)
Alkaline Phosphatase: 94 IU/L (ref 44–121)
BUN/Creatinine Ratio: 8 — ABNORMAL LOW (ref 9–23)
BUN: 6 mg/dL (ref 6–24)
Bilirubin Total: 0.3 mg/dL (ref 0.0–1.2)
CO2: 23 mmol/L (ref 20–29)
Calcium: 9.7 mg/dL (ref 8.7–10.2)
Chloride: 99 mmol/L (ref 96–106)
Creatinine, Ser: 0.79 mg/dL (ref 0.57–1.00)
Globulin, Total: 2.9 g/dL (ref 1.5–4.5)
Glucose: 74 mg/dL (ref 70–99)
Potassium: 4.4 mmol/L (ref 3.5–5.2)
Sodium: 138 mmol/L (ref 134–144)
Total Protein: 7.6 g/dL (ref 6.0–8.5)
eGFR: 94 mL/min/{1.73_m2} (ref >59)

## 2022-12-25 LAB — HEMOGLOBIN A1C
Est. average glucose Bld gHb Est-mCnc: 123 mg/dL
Hgb A1c MFr Bld: 5.9 % — ABNORMAL HIGH (ref 4.8–5.6)

## 2022-12-25 LAB — INSULIN, RANDOM: INSULIN: 15.4 u[IU]/mL (ref 2.6–24.9)

## 2022-12-25 LAB — VITAMIN B12: Vitamin B-12: 451 pg/mL (ref 232–1245)

## 2023-01-02 NOTE — Progress Notes (Signed)
Chief Complaint:   OBESITY Beth King (MR# [id number]) is a 47 y.o. female who presents for evaluation and treatment of obesity and related comorbidities. Current BMI is Body mass index is 35.36 kg/m. Beth King has been struggling with her weight for many years and has been unsuccessful in either losing weight, maintaining weight loss, or reaching her healthy weight goal.  [name] referred by Dr. Jaynee Eagles.  She is a Retail banker for [employer], 7am-7pm, 3 days a week.  Lives with mother (62) and husband (69).  Open to pregnancy.  Eats increased carb/plants Fufu-plantain and flour:Banku-fermented corn and yucca; Gari-yucca and flax seed.  Does not eat until 10 am.  Then banana or apple.  Lunch-plantain and sauce (tomato, onion, garlic, ginger) and fish/chicken (4 wings) (feels full).  4pm-5 pm or at 8 pm-Banku or Gari (2-2.5 cups) and vegetable soup and protein-chicken or fish.  She does not eat much beef or pork.  Bill is currently in the action stage of change and ready to dedicate time achieving and maintaining a healthier weight. Beth King is interested in becoming our patient and working on intensive lifestyle modifications including (but not limited to) diet and exercise for weight loss.  Wilmer's habits were reviewed today and are as follows: Her family eats meals together, she thinks her family will eat healthier with her, her desired weight loss is 60 lbs, she started gaining weight in 2018, her heaviest weight ever was 220 pounds, she has significant food cravings issues, she frequently eats larger portions than normal, and she struggles with emotional eating.  Depression Screen Jeana's Food and Mood (modified PHQ-9) score was 8.      No data to display         Subjective:   1. Other fatigue Kayleanna admits to daytime somnolence and denies waking up still tired. Patient has a history of symptoms of daytime fatigue and morning headache. [name] generally gets 7 hours of  sleep per night, and states that she has difficulty falling asleep. Snoring is not present. Apneic episodes are not present. Epworth Sleepiness Score is 8.    2. SOBOE (shortness of breath on exertion) [name] notes increasing shortness of breath with exercising and seems to be worsening over time with weight gain. She notes getting out of breath sooner with activity than she used to. This has not gotten worse recently. Beth King denies shortness of breath at rest or orthopnea.   3. Type 2 diabetes mellitus with hyperglycemia, without long-term current use of insulin (HCC) Xiadani is on Mounjaro 2.5 mg (Previously on Metformin and Wegovy).  Could not tolerate Semaglutide 5 mg.  Last eye exam 1 year ago.  4. Hypertension associated with diabetes ([place]) Diagnosed in 2022.  Taking Labetolol.  5. Hyperthyroidism Floree sees Endocrine.  Clinically euthyroid.  Stopped Methimazole and PTU.  Assessment/Plan:   1. Other fatigue We will obtain labs today.  [name] does feel that her weight is causing her energy to be lower than it should be. Fatigue may be related to obesity, depression or many other causes. Labs will be ordered, and in the meanwhile, Justyne will focus on self care including making healthy food choices, increasing physical activity and focusing on stress reduction.   - EKG 12-Lead - Vitamin B12 - Folate - VITAMIN D 25 Hydroxy (Vit-D Deficiency, Fractures)  2. SOBOE (shortness of breath on exertion) We will obtain labs today.  Triniti does feel that she gets out of breath more easily that she  used to when she exercises. Beth King's shortness of breath appears to be obesity related and exercise induced. She has agreed to work on weight loss and gradually increase exercise to treat her exercise induced shortness of breath. Will continue to monitor closely.   - CBC with Differential/Platelet  3. Type 2 diabetes mellitus with hyperglycemia, without long-term current use of insulin (HCC) We will  obtain labs today.  - Hemoglobin A1c - Insulin, random - Lipid Panel With LDL/HDL Ratio  4. Hypertension associated with diabetes ([name]) We will obtain labs today.  - Comprehensive metabolic panel  5. Hyperthyroidism Follow up with Endo.  6. Class 2 severe obesity with serious comorbidity and body mass index (BMI) of 35.0 to 35.9 in adult, unspecified obesity type [facility]) Beth King is currently in the action stage of change and her goal is to continue with weight loss efforts. I recommend Beth King begin the structured treatment plan as follows:  She has agreed to the Category 2 Plan, keeping a food journal and adhering to recommended goals of 300-400 for lunch-400-500 dinner calories and 25+lunch;35+dinner protein, and the [place].  Beth King makes most traditional food from scratch so discussed how to quantify this.  Exercise goals: No exercise has been prescribed at this time.   Behavioral modification strategies: increasing lean protein intake, meal planning and cooking strategies, and keeping healthy foods in the home.  She was informed of the importance of frequent follow-up visits to maximize her success with intensive lifestyle modifications for her multiple health conditions. She was informed we would discuss her lab results at her next visit unless there is a critical issue that needs to be addressed sooner. Beth King agreed to keep her next visit at the agreed upon time to discuss these results.  Objective:   Blood pressure 122/82, pulse 87, temperature 98.2 F (36.8 C), height '5\' 4"'$  (1.626 m), weight 206 lb (93.4 kg), last menstrual period 12/10/2022, SpO2 99 %. Body mass index is 35.36 kg/m.  EKG: Normal sinus rhythm, rate 81 bpm.  Indirect Calorimeter completed today shows a VO2 of 212 ml and a REE of 1454.  Her calculated basal metabolic rate is 6734 thus her basal metabolic rate is worse than expected.  General: Cooperative, alert, well developed, in no acute  distress. HEENT: Conjunctivae and lids unremarkable. Cardiovascular: Regular rhythm.  Lungs: Normal work of breathing. Neurologic: No focal deficits.   Lab Results  Component Value Date   CREATININE 0.79 12/24/2022   BUN 6 12/24/2022   NA 138 12/24/2022   K 4.4 12/24/2022   CL 99 12/24/2022   CO2 23 12/24/2022   Lab Results  Component Value Date   ALT 57 (H) 12/24/2022   AST 20 12/24/2022   ALKPHOS 94 12/24/2022   BILITOT 0.3 12/24/2022   Lab Results  Component Value Date   HGBA1C 5.9 (H) 12/24/2022   HGBA1C 6.6 (H) 04/20/2021   HGBA1C 6.3 10/10/2017   Lab Results  Component Value Date   INSULIN 15.4 12/24/2022   Lab Results  Component Value Date   TSH 0.89 11/04/2022   Lab Results  Component Value Date   CHOL 215 (H) 12/24/2022   HDL 78 12/24/2022   LDLCALC 127 (H) 12/24/2022   TRIG 54 12/24/2022   Lab Results  Component Value Date   WBC 6.7 12/24/2022   HGB 13.4 12/24/2022   HCT 40.2 12/24/2022   MCV 87 12/24/2022   PLT 385 12/24/2022   No results found for: "IRON", "TIBC", "FERRITIN"  Attestation  Statements:   Reviewed by clinician on day of visit: allergies, medications, problem list, medical history, surgical history, family history, social history, and previous encounter notes.  I, Elnora Morrison, RMA am acting as transcriptionist for Coralie Common, MD.  This is the patient's first visit at [facility]. The patient's NEW PATIENT PACKET was reviewed at length. Included in the packet: current and past health history, medications, allergies, ROS, gynecologic history (women only), surgical history, family history, social history, weight history, weight loss surgery history (for those that have had weight loss surgery), nutritional evaluation, mood and food questionnaire, PHQ9, Epworth questionnaire, sleep habits questionnaire, patient life and health improvement goals questionnaire. These will all be scanned into the patient's chart  under media.   During the visit, I independently reviewed the patient's EKG, bioimpedance scale results, and indirect calorimeter results. I used this information to tailor a meal plan for the patient that will help her to lose weight and will improve her obesity-related conditions going forward. I performed a medically necessary appropriate examination and/or evaluation. I discussed the assessment and treatment plan with the patient. The patient was provided an opportunity to ask questions and all were answered. The patient agreed with the plan and demonstrated an understanding of the instructions. Labs were ordered at this visit and will be reviewed at the next visit unless more critical results need to be addressed immediately. Clinical information was updated and documented in the EMR.    Time spent on visit including pre-visit chart review and post-visit care was 45 minutes.     I have reviewed the above documentation for accuracy and completeness, and I agree with the above. - Coralie Common, MD

## 2023-01-07 ENCOUNTER — Other Ambulatory Visit (HOSPITAL_COMMUNITY): Payer: Self-pay

## 2023-01-07 ENCOUNTER — Encounter (INDEPENDENT_AMBULATORY_CARE_PROVIDER_SITE_OTHER): Payer: Self-pay | Admitting: Family Medicine

## 2023-01-07 ENCOUNTER — Ambulatory Visit (INDEPENDENT_AMBULATORY_CARE_PROVIDER_SITE_OTHER): Payer: 59 | Admitting: Family Medicine

## 2023-01-07 VITALS — BP 126/84 | HR 84 | Temp 98.5°F | Ht 64.0 in | Wt 208.0 lb

## 2023-01-07 DIAGNOSIS — E669 Obesity, unspecified: Secondary | ICD-10-CM | POA: Diagnosis not present

## 2023-01-07 DIAGNOSIS — R7401 Elevation of levels of liver transaminase levels: Secondary | ICD-10-CM

## 2023-01-07 DIAGNOSIS — E559 Vitamin D deficiency, unspecified: Secondary | ICD-10-CM | POA: Diagnosis not present

## 2023-01-07 DIAGNOSIS — E785 Hyperlipidemia, unspecified: Secondary | ICD-10-CM | POA: Diagnosis not present

## 2023-01-07 DIAGNOSIS — I152 Hypertension secondary to endocrine disorders: Secondary | ICD-10-CM

## 2023-01-07 DIAGNOSIS — E1165 Type 2 diabetes mellitus with hyperglycemia: Secondary | ICD-10-CM | POA: Diagnosis not present

## 2023-01-07 DIAGNOSIS — E1169 Type 2 diabetes mellitus with other specified complication: Secondary | ICD-10-CM

## 2023-01-07 DIAGNOSIS — Z7985 Long-term (current) use of injectable non-insulin antidiabetic drugs: Secondary | ICD-10-CM

## 2023-01-07 DIAGNOSIS — E1159 Type 2 diabetes mellitus with other circulatory complications: Secondary | ICD-10-CM

## 2023-01-07 DIAGNOSIS — Z6835 Body mass index (BMI) 35.0-35.9, adult: Secondary | ICD-10-CM | POA: Diagnosis not present

## 2023-01-07 MED ORDER — VITAMIN D (ERGOCALCIFEROL) 1.25 MG (50000 UNIT) PO CAPS
50000.0000 [IU] | ORAL_CAPSULE | ORAL | 0 refills | Status: DC
  Filled 2023-01-07: qty 4, 28d supply, fill #0

## 2023-01-10 ENCOUNTER — Other Ambulatory Visit (HOSPITAL_COMMUNITY): Payer: Self-pay

## 2023-01-10 DIAGNOSIS — I10 Essential (primary) hypertension: Secondary | ICD-10-CM | POA: Diagnosis not present

## 2023-01-10 DIAGNOSIS — E1169 Type 2 diabetes mellitus with other specified complication: Secondary | ICD-10-CM | POA: Diagnosis not present

## 2023-01-10 DIAGNOSIS — E7800 Pure hypercholesterolemia, unspecified: Secondary | ICD-10-CM | POA: Diagnosis not present

## 2023-01-10 MED ORDER — ATORVASTATIN CALCIUM 10 MG PO TABS
10.0000 mg | ORAL_TABLET | Freq: Every day | ORAL | 1 refills | Status: AC
  Filled 2023-01-10: qty 30, 30d supply, fill #0
  Filled 2023-05-09: qty 30, 30d supply, fill #1

## 2023-01-10 MED ORDER — TELMISARTAN 40 MG PO TABS
40.0000 mg | ORAL_TABLET | Freq: Every day | ORAL | 1 refills | Status: DC
  Filled 2023-01-10: qty 30, 30d supply, fill #0

## 2023-01-10 MED ORDER — MOUNJARO 2.5 MG/0.5ML ~~LOC~~ SOAJ
2.5000 mg | SUBCUTANEOUS | 1 refills | Status: DC
  Filled 2023-01-10: qty 2, 28d supply, fill #0
  Filled 2023-02-03: qty 2, 28d supply, fill #1

## 2023-01-16 NOTE — Progress Notes (Signed)
Chief Complaint:   OBESITY Beth King is here to discuss her progress with her obesity treatment plan along with follow-up of her obesity related diagnoses. Beth King is on keeping a food journal and adhering to recommended goals of 300-400 for lunch; 400-500 dinner calories and 25 g for lunch; 35 g for supper protein and the [place] and states she is following her eating plan approximately 40% of the time. Beth King states she is exercising 0 minutes 0 times per week.  Today's visit was #: 2 Starting weight: 206 lbs Starting date: 12/24/2022 Today's weight: 208 lbs Today's date: 01/07/2023 Total lbs lost to date: 0 lbs Total lbs lost since last in-office visit: 0  Interim History: Beth King voices that she struggled with my fitness pal and some of food, was not accurately finding ingredients and understanding how to input what is in the recipe.  She does feel somewhat frustrated with weight gain.  She is going back to [place] the end of March.  Subjective:   1. Transaminitis Labs discussed during visit today.  Elevated ALT.  Prior elevations on previous RMR.  Likely NAFLD.  2. Vitamin D deficiency Labs discussed during visit today.  Vit D low at 17.7.  3. Hyperlipidemia associated with type 2 diabetes mellitus ([place]) Labs discussed during visit today.  LDL elevated at 127, HDL of 78, Trig of 54.  Not on medication.  4. Type 2 diabetes mellitus with hyperglycemia, without long-term current use of insulin ([place]) Labs discussed during visit today.  A1c 5.9 on Mounjaro 2.5 mg.  Still significant carb intake.  5. Hypertension associated with diabetes ([place]) Blood pressure controlled today.  Denies chest pain, chest pressure and headache.  Assessment/Plan:   1. Transaminitis Repeat labs in 3 months.  2. Vitamin D deficiency We will refill Vit D 50K IU once a week for 1 month with 0 refills.  -Refill Vitamin D, Ergocalciferol, (DRISDOL) 1.25 MG (50000 UNIT) CAPS capsule; Take 1 capsule  (50,000 Units total) by mouth every 7 (seven) days.  Dispense: 4 capsule; Refill: 0  3. Hyperlipidemia associated with type 2 diabetes mellitus ([place]) Will follow-up cholesterol panel in 3 months and consider medication at that time if necessary.  4. Type 2 diabetes mellitus with hyperglycemia, without long-term current use of insulin ([place]) Continue Mounjaro without any changes in dose.  5. Hypertension associated with diabetes ([place]) Continue Labetalol.  6. Obesity with starting BMI of 35.5 Beth King is currently in the action stage of change. As such, her goal is to continue with weight loss efforts. She has agreed to keeping a food journal and adhering to recommended goals of 300-400 for lunch; 400-800 for dinner calories and 25+ for lunch; 35+ for supper protein and the [place].   Exercise goals: All adults should avoid inactivity. Some physical activity is better than none, and adults who participate in any amount of physical activity gain some health benefits.  Behavioral modification strategies: increasing lean protein intake, meal planning and cooking strategies, keeping healthy foods in the home, and planning for success.  Beth King has agreed to follow-up with our clinic in 2 weeks. She was informed of the importance of frequent follow-up visits to maximize her success with intensive lifestyle modifications for her multiple health conditions.   Objective:   Blood pressure 126/84, pulse 84, temperature 98.5 F (36.9 C), height 5' 4"$  (1.626 m), weight 208 lb (94.3 kg), last menstrual period 12/10/2022, SpO2 100 %. Body mass index is 35.7 kg/m.  General: Cooperative,  alert, well developed, in no acute distress. HEENT: Conjunctivae and lids unremarkable. Cardiovascular: Regular rhythm.  Lungs: Normal work of breathing. Neurologic: No focal deficits.   Lab Results  Component Value Date   CREATININE 0.79 12/24/2022   BUN 6 12/24/2022   NA 138 12/24/2022   K 4.4 12/24/2022    CL 99 12/24/2022   CO2 23 12/24/2022   Lab Results  Component Value Date   ALT 57 (H) 12/24/2022   AST 20 12/24/2022   ALKPHOS 94 12/24/2022   BILITOT 0.3 12/24/2022   Lab Results  Component Value Date   HGBA1C 5.9 (H) 12/24/2022   HGBA1C 6.6 (H) 04/20/2021   HGBA1C 6.3 10/10/2017   Lab Results  Component Value Date   INSULIN 15.4 12/24/2022   Lab Results  Component Value Date   TSH 0.89 11/04/2022   Lab Results  Component Value Date   CHOL 215 (H) 12/24/2022   HDL 78 12/24/2022   LDLCALC 127 (H) 12/24/2022   TRIG 54 12/24/2022   Lab Results  Component Value Date   VD25OH 17.7 (L) 12/24/2022   Lab Results  Component Value Date   WBC 6.7 12/24/2022   HGB 13.4 12/24/2022   HCT 40.2 12/24/2022   MCV 87 12/24/2022   PLT 385 12/24/2022   No results found for: "IRON", "TIBC", "FERRITIN"  Attestation Statements:   Reviewed by clinician on day of visit: allergies, medications, problem list, medical history, surgical history, family history, social history, and previous encounter notes.  Time spent on visit including pre-visit chart review and post-visit care and charting was 45 minutes.   I, Elnora Morrison, RMA am acting as transcriptionist for Coralie Common, MD.  I have reviewed the above documentation for accuracy and completeness, and I agree with the above. - Coralie Common, MD

## 2023-01-20 ENCOUNTER — Other Ambulatory Visit (HOSPITAL_COMMUNITY): Payer: Self-pay

## 2023-01-20 ENCOUNTER — Ambulatory Visit (INDEPENDENT_AMBULATORY_CARE_PROVIDER_SITE_OTHER): Payer: 59 | Admitting: Family Medicine

## 2023-01-20 ENCOUNTER — Encounter (INDEPENDENT_AMBULATORY_CARE_PROVIDER_SITE_OTHER): Payer: Self-pay | Admitting: Family Medicine

## 2023-01-20 VITALS — BP 124/86 | HR 91 | Temp 98.6°F | Ht 64.0 in | Wt 208.0 lb

## 2023-01-20 DIAGNOSIS — E1169 Type 2 diabetes mellitus with other specified complication: Secondary | ICD-10-CM

## 2023-01-20 DIAGNOSIS — Z6835 Body mass index (BMI) 35.0-35.9, adult: Secondary | ICD-10-CM

## 2023-01-20 DIAGNOSIS — E669 Obesity, unspecified: Secondary | ICD-10-CM | POA: Diagnosis not present

## 2023-01-20 DIAGNOSIS — E559 Vitamin D deficiency, unspecified: Secondary | ICD-10-CM

## 2023-01-20 DIAGNOSIS — E785 Hyperlipidemia, unspecified: Secondary | ICD-10-CM

## 2023-01-20 MED ORDER — VITAMIN D (ERGOCALCIFEROL) 1.25 MG (50000 UNIT) PO CAPS
50000.0000 [IU] | ORAL_CAPSULE | ORAL | 0 refills | Status: DC
  Filled 2023-01-20 – 2023-02-03 (×2): qty 4, 28d supply, fill #0

## 2023-01-20 NOTE — Progress Notes (Deleted)
Since last appointment she been logging with MyFitnessPal.  She thinks she is doing it properly.  She is trying to not eat 3 meals a day and instead doing 2.  She is not reaching the 1400 calories daily.  Averaging around 1000 calories a day with average of 55g of Protein.  Younger sister is getting married first week in April in [place].  She is not hungry. She voices that she doesn't even notice hunger so may skip meals.  Patient mentions she can be more protein conscious.

## 2023-01-30 NOTE — Progress Notes (Signed)
Chief Complaint:   OBESITY Beth King is here to discuss her progress with her obesity treatment plan along with follow-up of her obesity related diagnoses. Beth King is on keeping a food journal and adhering to recommended goals of 300-400 at lunch and 400-800  calories and 25+ grams for lunch and 35+ gram of protein and the [place] and states she is following her eating plan approximately 60% of the time. Beth King states she is exercising 0 minutes 0 times per week.  Today's visit was #: 3 Starting weight: 206 lbs Starting date: 12/24/2022 Today's weight: 208 lbs Today's date: 01/20/2023 Total lbs lost to date: 2 lbs Total lbs lost since last in-office visit: 0  Interim History: Since last appointment Beth King has been logging with MyFitnessPal.  She thinks she is doing it properly.  She is trying to not eat 3 meals a day and instead doing 2.  She is not reaching the 1400 calories daily.  Averaging around 1000 calories a day with average of 55g of Protein.  Younger sister is getting married first week in April in [place].  She is not hungry. She voices that she doesn't even notice hunger so may skip meals.  Patient mentions she can be more protein conscious.  Subjective:   1. Vitamin D deficiency Beth King is currently taking prescription Vit D 50,000 IU once a week.  Denies any nausea, vomiting or muscle weakness.  She notes fatigue.  2. Hyperlipidemia associated with type 2 diabetes mellitus ([place]) On Lipitor now.  No side effects noted.  Assessment/Plan:   1. Vitamin D deficiency We will refill Vit D 50K IU once a week for 1 month with 0 refills.  -Refill Vitamin D, Ergocalciferol, (DRISDOL) 1.25 MG (50000 UNIT) CAPS capsule; Take 1 capsule (50,000 Units total) by mouth every 7 (seven) days.  Dispense: 4 capsule; Refill: 0  2. Hyperlipidemia associated with type 2 diabetes mellitus ([place]) Continue Lipitor with no refills needed.  3. BMI 35.0-35.9,adult  4. Obesity with starting  BMI of 35.5 Beth King is currently in the action stage of change. As such, her goal is to continue with weight loss efforts. She has agreed to keeping a food journal and adhering to recommended goals of 1200-1300 calories and 80+ grams of protein daily.   Exercise goals: No exercise has been prescribed at this time.  Behavioral modification strategies: increasing lean protein intake, meal planning and cooking strategies, keeping healthy foods in the home, and planning for success.  Beth King has agreed to follow-up with our clinic in 4 weeks. She was informed of the importance of frequent follow-up visits to maximize her success with intensive lifestyle modifications for her multiple health conditions.   Objective:   Blood pressure 124/86, pulse 91, temperature 98.6 F (37 C), height '5\' 4"'$  (1.626 m), weight 208 lb (94.3 kg), last menstrual period 12/10/2022, SpO2 98 %. Body mass index is 35.7 kg/m.  General: Cooperative, alert, well developed, in no acute distress. HEENT: Conjunctivae and lids unremarkable. Cardiovascular: Regular rhythm.  Lungs: Normal work of breathing. Neurologic: No focal deficits.   Lab Results  Component Value Date   CREATININE 0.79 12/24/2022   BUN 6 12/24/2022   NA 138 12/24/2022   K 4.4 12/24/2022   CL 99 12/24/2022   CO2 23 12/24/2022   Lab Results  Component Value Date   ALT 57 (H) 12/24/2022   AST 20 12/24/2022   ALKPHOS 94 12/24/2022   BILITOT 0.3 12/24/2022   Lab Results  Component  Value Date   HGBA1C 5.9 (H) 12/24/2022   HGBA1C 6.6 (H) 04/20/2021   HGBA1C 6.3 10/10/2017   Lab Results  Component Value Date   INSULIN 15.4 12/24/2022   Lab Results  Component Value Date   TSH 0.89 11/04/2022   Lab Results  Component Value Date   CHOL 215 (H) 12/24/2022   HDL 78 12/24/2022   LDLCALC 127 (H) 12/24/2022   TRIG 54 12/24/2022   Lab Results  Component Value Date   VD25OH 17.7 (L) 12/24/2022   Lab Results  Component Value Date   WBC 6.7  12/24/2022   HGB 13.4 12/24/2022   HCT 40.2 12/24/2022   MCV 87 12/24/2022   PLT 385 12/24/2022   No results found for: "IRON", "TIBC", "FERRITIN"  Attestation Statements:   Reviewed by clinician on day of visit: allergies, medications, problem list, medical history, surgical history, family history, social history, and previous encounter notes.  I, Elnora Morrison, RMA am acting as transcriptionist for Coralie Common, MD.  I have reviewed the above documentation for accuracy and completeness, and I agree with the above. - Coralie Common, MD

## 2023-02-03 ENCOUNTER — Other Ambulatory Visit (HOSPITAL_COMMUNITY): Payer: Self-pay

## 2023-02-04 ENCOUNTER — Other Ambulatory Visit (HOSPITAL_COMMUNITY): Payer: Self-pay

## 2023-02-10 ENCOUNTER — Other Ambulatory Visit (HOSPITAL_COMMUNITY): Payer: Self-pay

## 2023-02-10 ENCOUNTER — Encounter (INDEPENDENT_AMBULATORY_CARE_PROVIDER_SITE_OTHER): Payer: Self-pay | Admitting: Family Medicine

## 2023-02-10 ENCOUNTER — Ambulatory Visit (INDEPENDENT_AMBULATORY_CARE_PROVIDER_SITE_OTHER): Payer: 59 | Admitting: Family Medicine

## 2023-02-10 ENCOUNTER — Encounter (INDEPENDENT_AMBULATORY_CARE_PROVIDER_SITE_OTHER): Payer: Self-pay

## 2023-02-10 DIAGNOSIS — Z7184 Encounter for health counseling related to travel: Secondary | ICD-10-CM | POA: Diagnosis not present

## 2023-02-10 DIAGNOSIS — I10 Essential (primary) hypertension: Secondary | ICD-10-CM | POA: Diagnosis not present

## 2023-02-10 DIAGNOSIS — E7800 Pure hypercholesterolemia, unspecified: Secondary | ICD-10-CM | POA: Diagnosis not present

## 2023-02-10 MED ORDER — ATORVASTATIN CALCIUM 10 MG PO TABS
10.0000 mg | ORAL_TABLET | Freq: Every day | ORAL | 1 refills | Status: AC
  Filled 2023-02-10: qty 90, 90d supply, fill #0
  Filled 2023-09-12: qty 90, 90d supply, fill #1

## 2023-02-10 MED ORDER — ATOVAQUONE-PROGUANIL HCL 250-100 MG PO TABS
1.0000 | ORAL_TABLET | ORAL | 0 refills | Status: AC
  Filled 2023-02-10: qty 42, 42d supply, fill #0

## 2023-02-10 MED ORDER — TELMISARTAN 40 MG PO TABS
40.0000 mg | ORAL_TABLET | Freq: Every day | ORAL | 1 refills | Status: DC
  Filled 2023-02-10: qty 90, 90d supply, fill #0
  Filled 2023-03-03: qty 90, 90d supply, fill #1

## 2023-02-11 ENCOUNTER — Other Ambulatory Visit (HOSPITAL_COMMUNITY): Payer: Self-pay

## 2023-02-12 ENCOUNTER — Encounter (INDEPENDENT_AMBULATORY_CARE_PROVIDER_SITE_OTHER): Payer: Self-pay | Admitting: Family Medicine

## 2023-02-12 ENCOUNTER — Other Ambulatory Visit (HOSPITAL_COMMUNITY): Payer: Self-pay

## 2023-02-12 ENCOUNTER — Ambulatory Visit (INDEPENDENT_AMBULATORY_CARE_PROVIDER_SITE_OTHER): Payer: 59 | Admitting: Family Medicine

## 2023-02-12 VITALS — BP 134/89 | HR 95 | Temp 98.0°F | Ht 64.0 in | Wt 206.0 lb

## 2023-02-12 DIAGNOSIS — E559 Vitamin D deficiency, unspecified: Secondary | ICD-10-CM | POA: Diagnosis not present

## 2023-02-12 DIAGNOSIS — E1165 Type 2 diabetes mellitus with hyperglycemia: Secondary | ICD-10-CM | POA: Diagnosis not present

## 2023-02-12 DIAGNOSIS — Z7985 Long-term (current) use of injectable non-insulin antidiabetic drugs: Secondary | ICD-10-CM | POA: Diagnosis not present

## 2023-02-12 DIAGNOSIS — Z6835 Body mass index (BMI) 35.0-35.9, adult: Secondary | ICD-10-CM

## 2023-02-12 DIAGNOSIS — E669 Obesity, unspecified: Secondary | ICD-10-CM

## 2023-02-12 MED ORDER — TIRZEPATIDE 5 MG/0.5ML ~~LOC~~ SOAJ
5.0000 mg | SUBCUTANEOUS | 0 refills | Status: DC
  Filled 2023-02-12: qty 2, 28d supply, fill #0
  Filled 2023-04-15: qty 2, 28d supply, fill #1
  Filled 2023-05-09: qty 2, 28d supply, fill #2

## 2023-02-12 NOTE — Progress Notes (Deleted)
Patient leaves for [place] at the end of this month.  She has been tracking food daily.  She is getting used to being aware of ingredients and logging those to be aware of intake.  She is making substitutions in her log to get a decent idea of food intake.  Average calories are between 1200-1300 and average protein is 75g/day.  She is being more mindful of food prep now.  She will be in [place] until May.

## 2023-02-20 NOTE — Progress Notes (Signed)
Chief Complaint:   OBESITY Beth King is here to discuss her progress with her obesity treatment plan along with follow-up of her obesity related diagnoses. Beth King is on keeping a food journal with goal of 1200-1300 calories and 80+ grams of protein daily and states she is following her eating plan approximately 100% of the time. Beth King states she. has not been exercising.  Today's visit was #: 3 Starting weight: 206 lbs Starting date: 12/24/2022 Today's weight: 206 lbs Today's date: 02/12/2023 Total lbs lost to date: 0 Total lbs lost since last in-office visit: -2  Interim History:  Patient leaves for [place] at the end of this month.  She has been tracking food daily.  She is getting used to being aware of ingredients and logging those to be aware of intake.  She is making substitutions in her log to get a decent idea of food intake.  Average calories are between 1200-1300 and average protein is 75g/day.  She is being more mindful of food prep now.  She will be in [place] until May.    Subjective:   1. Type 2 diabetes mellitus with hyperglycemia, without long-term current use of insulin (HCC) On Mounjaro 2.5 mg subcu weekly (prescribed by Dr. Nancy Fetter). Last A1c slightly improved.  2. Vitamin D deficiency On vitamin D prescription. No nausea, vomiting, muscle weakness.  Assessment/Plan:   1. Type 2 diabetes mellitus with hyperglycemia, without long-term current use of insulin (HCC) Increase Mounjaro to 5 mg weekly: - tirzepatide (MOUNJARO) 5 MG/0.5ML Pen; Inject 5 mg into the skin once a week.  Dispense: 6 mL; Refill: 0  2. Vitamin D deficiency Continue prescription vitamin D.  No change in treatment.   2. BMI 35.0-35.9,adult Beth King is currently in the action stage of change. As such, her goal is to continue with weight loss efforts. She has agreed to keeping a food journal with goal of 1300-1600 calories and 80+ grams of protein daily.  Exercise goals: No exercise has been  prescribed at this time.  Behavioral modification strategies: increasing lean protein intake, meal planning and cooking strategies, keeping healthy foods in the home, and planning for success.  Beth King has agreed to follow-up with our clinic in 3 weeks. She was informed of the importance of frequent follow-up visits to maximize her success with intensive lifestyle modifications for her multiple health conditions.   Objective:   Blood pressure 134/89, pulse 95, temperature 98 F (36.7 C), height '5\' 4"'$  (1.626 m), weight 206 lb (93.4 kg), last menstrual period 02/06/2023, SpO2 100 %. Body mass index is 35.36 kg/m.  General: Cooperative, alert, well developed, in no acute distress. HEENT: Conjunctivae and lids unremarkable. Cardiovascular: Regular rhythm.  Lungs: Normal work of breathing. Neurologic: No focal deficits.   Lab Results  Component Value Date   CREATININE 0.79 12/24/2022   BUN 6 12/24/2022   NA 138 12/24/2022   K 4.4 12/24/2022   CL 99 12/24/2022   CO2 23 12/24/2022   Lab Results  Component Value Date   ALT 57 (H) 12/24/2022   AST 20 12/24/2022   ALKPHOS 94 12/24/2022   BILITOT 0.3 12/24/2022   Lab Results  Component Value Date   HGBA1C 5.9 (H) 12/24/2022   HGBA1C 6.6 (H) 04/20/2021   HGBA1C 6.3 10/10/2017   Lab Results  Component Value Date   INSULIN 15.4 12/24/2022   Lab Results  Component Value Date   TSH 0.89 11/04/2022   Lab Results  Component Value Date   CHOL  215 (H) 12/24/2022   HDL 78 12/24/2022   LDLCALC 127 (H) 12/24/2022   TRIG 54 12/24/2022   Lab Results  Component Value Date   VD25OH 17.7 (L) 12/24/2022   Lab Results  Component Value Date   WBC 6.7 12/24/2022   HGB 13.4 12/24/2022   HCT 40.2 12/24/2022   MCV 87 12/24/2022   PLT 385 12/24/2022   No results found for: "IRON", "TIBC", "FERRITIN"  Attestation Statements:   Reviewed by clinician on day of visit: allergies, medications, problem list, medical history, surgical  history, family history, social history, and previous encounter notes.  I, Dawn Whitmire, FNP-C, am acting as Location manager for Coralie Common, MD.  I have reviewed the above documentation for accuracy and completeness, and I agree with the above. - Coralie Common, MD

## 2023-03-03 ENCOUNTER — Encounter (INDEPENDENT_AMBULATORY_CARE_PROVIDER_SITE_OTHER): Payer: Self-pay | Admitting: Family Medicine

## 2023-03-03 ENCOUNTER — Other Ambulatory Visit (HOSPITAL_COMMUNITY): Payer: Self-pay

## 2023-03-03 ENCOUNTER — Ambulatory Visit (INDEPENDENT_AMBULATORY_CARE_PROVIDER_SITE_OTHER): Payer: 59 | Admitting: Family Medicine

## 2023-03-03 VITALS — BP 111/74 | HR 87 | Temp 98.0°F | Ht 64.0 in | Wt 206.0 lb

## 2023-03-03 DIAGNOSIS — E559 Vitamin D deficiency, unspecified: Secondary | ICD-10-CM

## 2023-03-03 DIAGNOSIS — I152 Hypertension secondary to endocrine disorders: Secondary | ICD-10-CM | POA: Diagnosis not present

## 2023-03-03 DIAGNOSIS — E669 Obesity, unspecified: Secondary | ICD-10-CM

## 2023-03-03 DIAGNOSIS — Z6835 Body mass index (BMI) 35.0-35.9, adult: Secondary | ICD-10-CM

## 2023-03-03 DIAGNOSIS — E1159 Type 2 diabetes mellitus with other circulatory complications: Secondary | ICD-10-CM | POA: Diagnosis not present

## 2023-03-03 MED ORDER — VITAMIN D (ERGOCALCIFEROL) 1.25 MG (50000 UNIT) PO CAPS
50000.0000 [IU] | ORAL_CAPSULE | ORAL | 0 refills | Status: AC
  Filled 2023-03-03 (×2): qty 4, 28d supply, fill #0

## 2023-03-03 NOTE — Progress Notes (Unsigned)
Chief Complaint:   OBESITY Beth King is here to discuss her progress with her obesity treatment plan along with follow-up of her obesity related diagnoses. Beth King is on keeping a food journal and adhering to recommended goals of 1300-1600 calories and 80+ protein and states she is following her eating plan approximately 70% of the time. Beth King states she is more active.  Today's visit was #: 4 Starting weight: 206 LBS Starting date: 12/24/2022 Today's weight: 206 LBS Today's date: 03/03/2023 Total lbs lost to date: 0 Total lbs lost since last in-office visit: 0  Interim History: Patient is moving more over the last few weeks.  The last weekend she had many gatherings and events which she indulged in food and drink.  She felt she had lost from last appointment.  She is going to go get the [facility] 5mg  dose after this appointment.  She is leaving for [place] in the next 5 days and will be back May 14th.  She is trying to stay mindful of food choices while away.  She is very excited to go as she hasn't been there for the last 3 years.   Subjective:   1. Vitamin D deficiency Patient is on prescription vitamin D.  Patient denies nausea, vomiting, muscle weakness, but is positive for fatigue.  2. Hypertension associated with diabetes [facility] Patient is on Micardis and labetalol.  Patient denies chest pain, chest pressure, headache.  Assessment/Plan:   1. Vitamin D deficiency Refill- Vitamin D, Ergocalciferol, (DRISDOL) 1.25 MG (50000 UNIT) CAPS capsule; Take 1 capsule (50,000 Units total) by mouth every 7 (seven) days.  Dispense: 4 capsule; Refill: 0  2. Hypertension associated with diabetes [facility]) [place] is currently in the action stage of change. As such, her goal is to continue with weight loss efforts. She has agreed to keeping a food journal and adhering to recommended goals of 1300-1400 calories and 80+ protein.   Exercise goals: All adults should avoid inactivity. Some physical activity  is better than none, and adults who participate in any amount of physical activity gain some health benefits.  Behavioral modification strategies: increasing lean protein intake, meal planning and cooking strategies, keeping healthy foods in the home, and planning for success.  Beth King has agreed to follow-up with our clinic in 8 weeks. She was informed of the importance of frequent follow-up visits to maximize her success with intensive lifestyle modifications for her multiple health conditions.   Objective:   Blood pressure 111/74, pulse 87, temperature 98 F (36.7 C), height 5\' 4"  (1.626 m), weight 206 lb (93.4 kg), last menstrual period 02/06/2023, SpO2 100 %. Body mass index is 35.36 kg/m.  General: Cooperative, alert, well developed, in no acute distress. HEENT: Conjunctivae and lids unremarkable. Cardiovascular: Regular rhythm.  Lungs: Normal work of breathing. Neurologic: No focal deficits.   Lab Results  Component Value Date   CREATININE 0.79 12/24/2022   BUN 6 12/24/2022   NA 138 12/24/2022   K 4.4 12/24/2022   CL 99 12/24/2022   CO2 23 12/24/2022   Lab Results  Component Value Date   ALT 57 (H) 12/24/2022   AST 20 12/24/2022   ALKPHOS 94 12/24/2022   BILITOT 0.3 12/24/2022   Lab Results  Component Value Date   HGBA1C 5.9 (H) 12/24/2022   HGBA1C 6.6 (H) 04/20/2021   HGBA1C 6.3 10/10/2017   Lab Results  Component Value Date   INSULIN 15.4 12/24/2022   Lab Results  Component Value Date   TSH 0.89  11/04/2022   Lab Results  Component Value Date   CHOL 215 (H) 12/24/2022   HDL 78 12/24/2022   LDLCALC 127 (H) 12/24/2022   TRIG 54 12/24/2022   Lab Results  Component Value Date   VD25OH 17.7 (L) 12/24/2022   Lab Results  Component Value Date   WBC 6.7 12/24/2022   HGB 13.4 12/24/2022   HCT 40.2 12/24/2022   MCV 87 12/24/2022   PLT 385 12/24/2022   No results found for: "IRON", "TIBC", "FERRITIN"  Attestation Statements:   Reviewed by clinician  on day of visit: allergies, medications, problem list, medical history, surgical history, family history, social history, and previous encounter notes.  I, Davy Pique, RMA, am acting as transcriptionist for Coralie Common, MD. I have reviewed the above documentation for accuracy and completeness, and I agree with the above. - Coralie Common, MD

## 2023-03-31 ENCOUNTER — Ambulatory Visit: Payer: No Typology Code available for payment source | Admitting: Internal Medicine

## 2023-04-16 ENCOUNTER — Other Ambulatory Visit (HOSPITAL_COMMUNITY): Payer: Self-pay

## 2023-04-17 ENCOUNTER — Other Ambulatory Visit (HOSPITAL_COMMUNITY): Payer: Self-pay

## 2023-04-25 DIAGNOSIS — H31002 Unspecified chorioretinal scars, left eye: Secondary | ICD-10-CM | POA: Diagnosis not present

## 2023-04-25 DIAGNOSIS — H524 Presbyopia: Secondary | ICD-10-CM | POA: Diagnosis not present

## 2023-04-28 ENCOUNTER — Ambulatory Visit (INDEPENDENT_AMBULATORY_CARE_PROVIDER_SITE_OTHER): Payer: 59 | Admitting: Family Medicine

## 2023-05-12 ENCOUNTER — Encounter: Payer: Self-pay | Admitting: Internal Medicine

## 2023-05-12 ENCOUNTER — Ambulatory Visit (INDEPENDENT_AMBULATORY_CARE_PROVIDER_SITE_OTHER): Payer: 59 | Admitting: Internal Medicine

## 2023-05-12 VITALS — BP 122/76 | HR 100 | Ht 64.0 in | Wt 209.0 lb

## 2023-05-12 DIAGNOSIS — E0590 Thyrotoxicosis, unspecified without thyrotoxic crisis or storm: Secondary | ICD-10-CM | POA: Diagnosis not present

## 2023-05-12 LAB — T4, FREE: Free T4: 0.75 ng/dL (ref 0.60–1.60)

## 2023-05-12 LAB — TSH: TSH: 0.75 u[IU]/mL (ref 0.35–5.50)

## 2023-05-12 NOTE — Progress Notes (Unsigned)
Name: Beth King  MRN/ DOB: [id number], [birth year]    Age/ Sex: 47 y.o., female     PCP: Deatra James, MD   Reason for Endocrinology Evaluation: Hyperthyroidism     Initial Endocrinology Clinic Visit: 04/09/2016    PATIENT IDENTIFIER: Beth King is a 47 y.o., female with a past medical history of Hyperthyroidism. She has followed with [facility] since 04/09/2016 for consultative assistance with management of her hyperthyroidism.   HISTORICAL SUMMARY: The patient was first diagnosed with hyperthyroidism in 2017. This has been attributed to the possibility of Graves' disease No prior imaging  She has been on methimazole and PTU in the past    She had a miscarriage in 2022  No Fh of thyroid disease   SUBJECTIVE:    Today (05/12/2023):  Ms. Hook is here for a follow up on hyperthyroidism.   Weight stable  Denies recent palpitations  Denies local neck swelling  Denies tremors  She is on Mounjaro and attributes to constipation  Denies eye symptoms of itching or burning in the eyes     HISTORY:  Past Medical History:  Past Medical History:  Diagnosis Date   Allergic rhinitis    Cardiomegaly 2007   incidental finding, no tx required per pt.   Diabetes mellitus without complication (HCC)    Enlarged uterus    Fibroid    Heartburn    High blood pressure    Hyperthyroidism    Migraine    Plantar fasciitis    Pre-diabetes    Seasonal allergies    Past Surgical History:  Past Surgical History:  Procedure Laterality Date   MYOMECTOMY N/A 04/24/2021   Procedure: ABDOMINAL MYOMECTOMY;  Surgeon: Edwinna Areola, DO;  Location: [facility] OR;  Service: Gynecology;  Laterality: N/A;   Social History:  reports that she has never smoked. She has never used smokeless tobacco. She reports current alcohol use. She reports that she does not use drugs. Family History:  Family History  Problem Relation Age of Onset   Hyperlipidemia Mother    Diabetes  Mother    Hypertension Mother    Obesity Mother    Obesity Father    Arthritis Father    Thyroid disease Neg Hx    Migraines Neg Hx      HOME MEDICATIONS: Allergies as of 05/12/2023       Reactions   Chloroquine Phosphate [chloroquine] Itching   "itching all over my body for almost like a week"        Medication List        Accurate as of May 12, 2023  1:10 PM. If you have any questions, ask your nurse or doctor.          acetaminophen 500 MG tablet Commonly known as: TYLENOL Take 1,000 mg by mouth as needed.   atorvastatin 10 MG tablet Commonly known as: LIPITOR Take 1 tablet (10 mg total) by mouth daily.   atorvastatin 10 MG tablet Commonly known as: LIPITOR Take 1 tablet (10 mg total) by mouth daily.   atovaquone-proguanil 250-100 MG Tabs tablet Commonly known as: Malarone Take 1 tablet by mouth once a day, start 1 day before travel and continue for a 7 days after returning   BLACK COHOSH EXTRACT PO Take by mouth.   celecoxib 100 MG capsule Commonly known as: CeleBREX Take 1 capsule (100 mg total) by mouth 2 (two) times daily.   Evening Primrose Oil 500 MG Caps Take 500 mg  by mouth.   folic acid 800 MCG tablet Commonly known as: FOLVITE Take 800 mcg by mouth daily.   freestyle lancets Test once daily as directed   FREESTYLE LITE test strip Generic drug: glucose blood Test once daily as directed   FreeStyle Lite w/Device Kit Use as directed   ibuprofen 200 MG tablet Commonly known as: ADVIL Take 200 mg by mouth every 6 (six) hours as needed.   labetalol 100 MG tablet Commonly known as: NORMODYNE Take 1 tablet (100 mg total) by mouth 2 (two) times daily.   levocetirizine 5 MG tablet Commonly known as: XYZAL Take 1 tablet (5 mg total) by mouth daily as needed.   Mounjaro 5 MG/0.5ML Pen Generic drug: tirzepatide Inject 5 mg into the skin once a week.   Nurtec 75 MG Tbdp Generic drug: Rimegepant Sulfate Take 75 mg by mouth daily at 6  (six) AM.   omeprazole 20 MG capsule Commonly known as: PRILOSEC Take 1 capsule (20 mg total) by mouth 2 (two) times daily before a meal.   RED CLOVER LEAF EXTRACT PO Take by mouth.   telmisartan 40 MG tablet Commonly known as: MICARDIS Take 1 tablet (40 mg total) by mouth daily.   Vitamin D (Ergocalciferol) 1.25 MG (50000 UNIT) Caps capsule Commonly known as: DRISDOL Take 1 capsule (50,000 Units total) by mouth every 7 (seven) days.          OBJECTIVE:   PHYSICAL EXAM: VS: BP 122/76 (BP Location: Left Arm, Patient Position: Sitting, Cuff Size: Large)   Pulse 100   Ht 5\' 4"  (1.626 m)   Wt 209 lb (94.8 kg)   SpO2 98%   BMI 35.87 kg/m    EXAM: General: Pt appears well and is in NAD  Neck: General: Supple without adenopathy. Thyroid: Thyroid size normal.  No goiter or nodules appreciated.   Lungs: Clear with good BS bilat with no rales, rhonchi, or wheezes  Heart: Auscultation: RRR.  Extremities:  BL LE: No pretibial edema   Mental Status: Judgment, insight: Intact Orientation: Oriented to time, place, and person Mood and affect: No depression, anxiety, or agitation     DATA REVIEWED:  Latest Reference Range & Units 05/12/23 13:26  TSH 0.35 - 5.50 uIU/mL 0.75  Triiodothyronine (T3) 76 - 181 ng/dL 75 (L)  Z6,XWRU(EAVWUJ) 0.60 - 1.60 ng/dL 8.11  (L): Data is abnormally low  Latest Reference Range & Units 12/24/22 10:18  COMPREHENSIVE METABOLIC PANEL  Rpt !  Sodium 134 - 144 mmol/L 138  Potassium 3.5 - 5.2 mmol/L 4.4  Chloride 96 - 106 mmol/L 99  CO2 20 - 29 mmol/L 23  Glucose 70 - 99 mg/dL 74  BUN 6 - 24 mg/dL 6  Creatinine 9.14 - 7.82 mg/dL 9.56  Calcium 8.7 - 21.3 mg/dL 9.7  BUN/Creatinine Ratio 9 - 23  8 (L)  eGFR >59 mL/min/1.73 94  Alkaline Phosphatase 44 - 121 IU/L 94  Albumin 3.9 - 4.9 g/dL 4.7  Albumin/Globulin Ratio 1.2 - 2.2  1.6  AST 0 - 40 IU/L 20  ALT 0 - 32 IU/L 57 (H)  Total Protein 6.0 - 8.5 g/dL 7.6  Total Bilirubin 0.0 - 1.2 mg/dL  0.3    Latest Reference Range & Units 12/24/22 10:18  WBC 3.4 - 10.8 x10E3/uL 6.7  RBC 3.77 - 5.28 x10E6/uL 4.62  Hemoglobin 11.1 - 15.9 g/dL 08.6  HCT 57.8 - 46.9 % 40.2  MCV 79 - 97 fL 87  MCH 26.6 - 33.0 pg  29.0  MCHC 31.5 - 35.7 g/dL 40.9  RDW 81.1 - 91.4 % 14.5  Platelets 150 - 450 x10E3/uL 385     ASSESSMENT / PLAN / RECOMMENDATIONS:   Hyperthyroidism  - Pt is clinically euthyroid  - No local neck symptoms  - She has been off PTU  since 2023 -Repeat TFTs show normal TSH, and free T4, total T3 is low but I suspect this is due to low thyroxine binding globulin -No intervention at this time   F/U in 6 months   Repeat labs in 6 weeks  Signed electronically by: Lyndle Herrlich, MD  [facility]  [facility] [address] [place] [place], [place] [address] Phone: [phone] FAX: [phone]      CC: Deatra James, MD [address] [place] [address] Phone: [phone]  Fax: [phone]   Return to Endocrinology clinic as below: No future appointments.

## 2023-05-13 LAB — T3: T3, Total: 75 ng/dL — ABNORMAL LOW (ref 76–181)

## 2023-05-26 DIAGNOSIS — N926 Irregular menstruation, unspecified: Secondary | ICD-10-CM | POA: Diagnosis not present

## 2023-06-02 ENCOUNTER — Other Ambulatory Visit (INDEPENDENT_AMBULATORY_CARE_PROVIDER_SITE_OTHER): Payer: Self-pay | Admitting: Family Medicine

## 2023-06-02 ENCOUNTER — Other Ambulatory Visit (HOSPITAL_COMMUNITY): Payer: Self-pay

## 2023-06-02 DIAGNOSIS — E1165 Type 2 diabetes mellitus with hyperglycemia: Secondary | ICD-10-CM

## 2023-06-03 ENCOUNTER — Other Ambulatory Visit (HOSPITAL_COMMUNITY): Payer: Self-pay

## 2023-06-04 ENCOUNTER — Other Ambulatory Visit (HOSPITAL_COMMUNITY): Payer: Self-pay

## 2023-06-04 MED ORDER — FREESTYLE TEST VI STRP
ORAL_STRIP | 0 refills | Status: AC
  Filled 2023-06-04: qty 50, 50d supply, fill #0
  Filled 2023-06-04: qty 100, 90d supply, fill #0

## 2023-06-06 ENCOUNTER — Other Ambulatory Visit (HOSPITAL_COMMUNITY): Payer: Self-pay

## 2023-06-10 ENCOUNTER — Other Ambulatory Visit (HOSPITAL_COMMUNITY): Payer: Self-pay

## 2023-06-10 MED ORDER — MOUNJARO 5 MG/0.5ML ~~LOC~~ SOAJ
5.0000 mg | SUBCUTANEOUS | 1 refills | Status: DC
  Filled 2023-06-10: qty 2, 28d supply, fill #0
  Filled 2023-07-06: qty 2, 28d supply, fill #1

## 2023-06-13 ENCOUNTER — Other Ambulatory Visit (HOSPITAL_COMMUNITY): Payer: Self-pay

## 2023-07-01 DIAGNOSIS — Z01411 Encounter for gynecological examination (general) (routine) with abnormal findings: Secondary | ICD-10-CM | POA: Diagnosis not present

## 2023-07-01 DIAGNOSIS — Z1389 Encounter for screening for other disorder: Secondary | ICD-10-CM | POA: Diagnosis not present

## 2023-07-01 DIAGNOSIS — E282 Polycystic ovarian syndrome: Secondary | ICD-10-CM | POA: Diagnosis not present

## 2023-07-01 DIAGNOSIS — Z130 Encounter for screening for diseases of the blood and blood-forming organs and certain disorders involving the immune mechanism: Secondary | ICD-10-CM | POA: Diagnosis not present

## 2023-07-01 DIAGNOSIS — Z1211 Encounter for screening for malignant neoplasm of colon: Secondary | ICD-10-CM | POA: Diagnosis not present

## 2023-07-07 ENCOUNTER — Other Ambulatory Visit (HOSPITAL_COMMUNITY): Payer: Self-pay

## 2023-07-07 DIAGNOSIS — L309 Dermatitis, unspecified: Secondary | ICD-10-CM | POA: Diagnosis not present

## 2023-07-07 MED ORDER — TRIAMCINOLONE ACETONIDE 0.5 % EX CREA
1.0000 | TOPICAL_CREAM | Freq: Every day | CUTANEOUS | 0 refills | Status: AC
  Filled 2023-07-07: qty 45, 10d supply, fill #0

## 2023-07-08 DIAGNOSIS — K219 Gastro-esophageal reflux disease without esophagitis: Secondary | ICD-10-CM | POA: Diagnosis not present

## 2023-07-08 DIAGNOSIS — Z1211 Encounter for screening for malignant neoplasm of colon: Secondary | ICD-10-CM | POA: Diagnosis not present

## 2023-07-08 DIAGNOSIS — K5900 Constipation, unspecified: Secondary | ICD-10-CM | POA: Diagnosis not present

## 2023-07-24 ENCOUNTER — Other Ambulatory Visit (HOSPITAL_COMMUNITY): Payer: Self-pay

## 2023-07-30 DIAGNOSIS — E7800 Pure hypercholesterolemia, unspecified: Secondary | ICD-10-CM | POA: Diagnosis not present

## 2023-07-30 DIAGNOSIS — E0590 Thyrotoxicosis, unspecified without thyrotoxic crisis or storm: Secondary | ICD-10-CM | POA: Diagnosis not present

## 2023-07-30 DIAGNOSIS — I10 Essential (primary) hypertension: Secondary | ICD-10-CM | POA: Diagnosis not present

## 2023-07-30 DIAGNOSIS — G43909 Migraine, unspecified, not intractable, without status migrainosus: Secondary | ICD-10-CM | POA: Diagnosis not present

## 2023-07-30 DIAGNOSIS — Z0000 Encounter for general adult medical examination without abnormal findings: Secondary | ICD-10-CM | POA: Diagnosis not present

## 2023-07-30 DIAGNOSIS — E6609 Other obesity due to excess calories: Secondary | ICD-10-CM | POA: Diagnosis not present

## 2023-07-30 DIAGNOSIS — Z6836 Body mass index (BMI) 36.0-36.9, adult: Secondary | ICD-10-CM | POA: Diagnosis not present

## 2023-07-30 DIAGNOSIS — E119 Type 2 diabetes mellitus without complications: Secondary | ICD-10-CM | POA: Diagnosis not present

## 2023-07-30 DIAGNOSIS — Z13220 Encounter for screening for lipoid disorders: Secondary | ICD-10-CM | POA: Diagnosis not present

## 2023-08-05 ENCOUNTER — Other Ambulatory Visit (HOSPITAL_COMMUNITY): Payer: Self-pay

## 2023-08-05 MED ORDER — LABETALOL HCL 100 MG PO TABS
100.0000 mg | ORAL_TABLET | Freq: Every day | ORAL | 1 refills | Status: DC
  Filled 2023-08-05: qty 90, 90d supply, fill #0
  Filled 2024-05-04: qty 90, 90d supply, fill #1

## 2023-08-05 MED ORDER — MOUNJARO 5 MG/0.5ML ~~LOC~~ SOAJ
5.0000 mg | SUBCUTANEOUS | 1 refills | Status: AC
  Filled 2023-08-05: qty 6, 84d supply, fill #0

## 2023-09-01 ENCOUNTER — Other Ambulatory Visit (HOSPITAL_COMMUNITY): Payer: Self-pay

## 2023-09-01 MED ORDER — GOLYTELY 236 G PO SOLR
ORAL | 0 refills | Status: AC
  Filled 2023-09-01: qty 4000, 1d supply, fill #0

## 2023-09-03 ENCOUNTER — Other Ambulatory Visit (HOSPITAL_COMMUNITY): Payer: Self-pay

## 2023-09-03 MED ORDER — LABETALOL HCL 100 MG PO TABS
100.0000 mg | ORAL_TABLET | Freq: Two times a day (BID) | ORAL | 1 refills | Status: AC
  Filled 2023-09-03 – 2023-09-04 (×2): qty 180, 90d supply, fill #0
  Filled 2024-07-29: qty 180, 90d supply, fill #1
  Filled ????-??-??: fill #0

## 2023-09-04 ENCOUNTER — Other Ambulatory Visit (HOSPITAL_COMMUNITY): Payer: Self-pay

## 2023-09-11 ENCOUNTER — Other Ambulatory Visit (HOSPITAL_COMMUNITY): Payer: Self-pay

## 2023-09-15 DIAGNOSIS — D120 Benign neoplasm of cecum: Secondary | ICD-10-CM | POA: Diagnosis not present

## 2023-09-15 DIAGNOSIS — Z1211 Encounter for screening for malignant neoplasm of colon: Secondary | ICD-10-CM | POA: Diagnosis not present

## 2023-09-15 DIAGNOSIS — K635 Polyp of colon: Secondary | ICD-10-CM | POA: Diagnosis not present

## 2023-10-20 ENCOUNTER — Other Ambulatory Visit: Payer: Self-pay | Admitting: Family Medicine

## 2023-10-20 DIAGNOSIS — Z1231 Encounter for screening mammogram for malignant neoplasm of breast: Secondary | ICD-10-CM

## 2023-11-11 ENCOUNTER — Ambulatory Visit: Payer: 59 | Admitting: Internal Medicine

## 2023-11-24 ENCOUNTER — Ambulatory Visit: Payer: 59

## 2023-12-18 ENCOUNTER — Other Ambulatory Visit (HOSPITAL_COMMUNITY): Payer: Self-pay

## 2023-12-19 ENCOUNTER — Other Ambulatory Visit (HOSPITAL_COMMUNITY): Payer: Self-pay

## 2023-12-19 MED ORDER — LEVOCETIRIZINE DIHYDROCHLORIDE 5 MG PO TABS
5.0000 mg | ORAL_TABLET | Freq: Every day | ORAL | 0 refills | Status: DC | PRN
  Filled 2023-12-19: qty 90, 90d supply, fill #0

## 2024-01-30 DIAGNOSIS — E1169 Type 2 diabetes mellitus with other specified complication: Secondary | ICD-10-CM | POA: Diagnosis not present

## 2024-01-30 DIAGNOSIS — E559 Vitamin D deficiency, unspecified: Secondary | ICD-10-CM | POA: Diagnosis not present

## 2024-01-30 DIAGNOSIS — I10 Essential (primary) hypertension: Secondary | ICD-10-CM | POA: Diagnosis not present

## 2024-04-26 DIAGNOSIS — H5213 Myopia, bilateral: Secondary | ICD-10-CM | POA: Diagnosis not present

## 2024-05-04 ENCOUNTER — Other Ambulatory Visit (HOSPITAL_COMMUNITY): Payer: Self-pay

## 2024-05-04 ENCOUNTER — Other Ambulatory Visit: Payer: Self-pay

## 2024-05-07 ENCOUNTER — Other Ambulatory Visit (HOSPITAL_COMMUNITY): Payer: Self-pay

## 2024-05-07 MED ORDER — LEVOCETIRIZINE DIHYDROCHLORIDE 5 MG PO TABS
5.0000 mg | ORAL_TABLET | Freq: Every day | ORAL | 0 refills | Status: AC | PRN
  Filled 2024-05-07: qty 90, 90d supply, fill #0

## 2024-06-17 DIAGNOSIS — E1169 Type 2 diabetes mellitus with other specified complication: Secondary | ICD-10-CM | POA: Diagnosis not present

## 2024-06-17 DIAGNOSIS — I10 Essential (primary) hypertension: Secondary | ICD-10-CM | POA: Diagnosis not present

## 2024-06-17 DIAGNOSIS — R42 Dizziness and giddiness: Secondary | ICD-10-CM | POA: Diagnosis not present

## 2024-07-13 DIAGNOSIS — N915 Oligomenorrhea, unspecified: Secondary | ICD-10-CM | POA: Diagnosis not present

## 2024-07-13 DIAGNOSIS — Z130 Encounter for screening for diseases of the blood and blood-forming organs and certain disorders involving the immune mechanism: Secondary | ICD-10-CM | POA: Diagnosis not present

## 2024-07-13 DIAGNOSIS — Z01419 Encounter for gynecological examination (general) (routine) without abnormal findings: Secondary | ICD-10-CM | POA: Diagnosis not present

## 2024-07-13 DIAGNOSIS — Z1389 Encounter for screening for other disorder: Secondary | ICD-10-CM | POA: Diagnosis not present

## 2024-07-13 DIAGNOSIS — Z1151 Encounter for screening for human papillomavirus (HPV): Secondary | ICD-10-CM | POA: Diagnosis not present

## 2024-07-13 DIAGNOSIS — Z124 Encounter for screening for malignant neoplasm of cervix: Secondary | ICD-10-CM | POA: Diagnosis not present

## 2024-07-15 DIAGNOSIS — Z1231 Encounter for screening mammogram for malignant neoplasm of breast: Secondary | ICD-10-CM | POA: Diagnosis not present

## 2024-07-30 ENCOUNTER — Other Ambulatory Visit (HOSPITAL_COMMUNITY): Payer: Self-pay

## 2024-08-17 DIAGNOSIS — E6609 Other obesity due to excess calories: Secondary | ICD-10-CM | POA: Diagnosis not present

## 2024-08-17 DIAGNOSIS — G43909 Migraine, unspecified, not intractable, without status migrainosus: Secondary | ICD-10-CM | POA: Diagnosis not present

## 2024-08-17 DIAGNOSIS — E119 Type 2 diabetes mellitus without complications: Secondary | ICD-10-CM | POA: Diagnosis not present

## 2024-08-17 DIAGNOSIS — Z0000 Encounter for general adult medical examination without abnormal findings: Secondary | ICD-10-CM | POA: Diagnosis not present

## 2024-08-17 DIAGNOSIS — Z6835 Body mass index (BMI) 35.0-35.9, adult: Secondary | ICD-10-CM | POA: Diagnosis not present

## 2024-08-17 DIAGNOSIS — I10 Essential (primary) hypertension: Secondary | ICD-10-CM | POA: Diagnosis not present

## 2024-10-04 ENCOUNTER — Other Ambulatory Visit (HOSPITAL_COMMUNITY): Payer: Self-pay

## 2024-10-05 ENCOUNTER — Other Ambulatory Visit (HOSPITAL_COMMUNITY): Payer: Self-pay

## 2024-10-06 ENCOUNTER — Other Ambulatory Visit (HOSPITAL_COMMUNITY): Payer: Self-pay

## 2024-10-06 MED ORDER — LABETALOL HCL 100 MG PO TABS: 100.0000 mg | ORAL_TABLET | Freq: Every day | ORAL | 1 refills | Status: AC

## 2024-10-07 ENCOUNTER — Encounter (HOSPITAL_COMMUNITY): Payer: Self-pay

## 2024-10-07 ENCOUNTER — Other Ambulatory Visit (HOSPITAL_COMMUNITY): Payer: Self-pay

## 2024-10-22 ENCOUNTER — Encounter (HOSPITAL_COMMUNITY): Payer: Self-pay

## 2024-10-22 ENCOUNTER — Other Ambulatory Visit (HOSPITAL_COMMUNITY): Payer: Self-pay

## 2024-10-22 MED ORDER — CONTRAVE 8-90 MG PO TB12
2.0000 | ORAL_TABLET | Freq: Two times a day (BID) | ORAL | 0 refills | Status: AC
  Filled 2024-10-22: qty 120, 30d supply, fill #0

## 2024-10-22 MED ORDER — ZEPBOUND 2.5 MG/0.5ML ~~LOC~~ SOAJ
2.5000 mg | SUBCUTANEOUS | 0 refills | Status: AC
  Filled 2024-10-22: qty 2, 28d supply, fill #0
